# Patient Record
Sex: Female | Born: 1960 | Race: White | Hispanic: No | Marital: Single | Smoking: Never smoker
Health system: Southern US, Community
[De-identification: ages and names within clinical notes are randomized; demographics above are authoritative.]

## PROBLEM LIST (undated history)

## (undated) DIAGNOSIS — Z923 Personal history of irradiation: Secondary | ICD-10-CM

## (undated) DIAGNOSIS — C50919 Malignant neoplasm of unspecified site of unspecified female breast: Secondary | ICD-10-CM

## (undated) DIAGNOSIS — K219 Gastro-esophageal reflux disease without esophagitis: Secondary | ICD-10-CM

## (undated) DIAGNOSIS — E785 Hyperlipidemia, unspecified: Secondary | ICD-10-CM

## (undated) DIAGNOSIS — K5190 Ulcerative colitis, unspecified, without complications: Secondary | ICD-10-CM

## (undated) DIAGNOSIS — I10 Essential (primary) hypertension: Secondary | ICD-10-CM

## (undated) DIAGNOSIS — Z8719 Personal history of other diseases of the digestive system: Secondary | ICD-10-CM

## (undated) HISTORY — PX: ABDOMINAL HYSTERECTOMY: SHX81

---
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
## 1991-09-14 HISTORY — PX: WISDOM TOOTH EXTRACTION: SHX21

## 2004-08-27 ENCOUNTER — Ambulatory Visit: Payer: Self-pay | Admitting: Obstetrics and Gynecology

## 2004-09-16 ENCOUNTER — Ambulatory Visit: Payer: Self-pay | Admitting: Obstetrics and Gynecology

## 2005-08-09 ENCOUNTER — Ambulatory Visit: Payer: Self-pay | Admitting: Obstetrics and Gynecology

## 2006-09-29 ENCOUNTER — Ambulatory Visit: Payer: Self-pay | Admitting: Obstetrics and Gynecology

## 2007-10-12 ENCOUNTER — Ambulatory Visit: Payer: Self-pay | Admitting: Obstetrics and Gynecology

## 2008-10-25 ENCOUNTER — Ambulatory Visit: Payer: Self-pay | Admitting: Obstetrics and Gynecology

## 2009-10-27 ENCOUNTER — Ambulatory Visit: Payer: Self-pay | Admitting: Obstetrics and Gynecology

## 2010-10-28 ENCOUNTER — Ambulatory Visit: Payer: Self-pay | Admitting: Obstetrics and Gynecology

## 2011-11-24 ENCOUNTER — Ambulatory Visit: Payer: Self-pay | Admitting: Obstetrics and Gynecology

## 2012-02-02 ENCOUNTER — Ambulatory Visit: Payer: Self-pay | Admitting: Gastroenterology

## 2012-03-17 ENCOUNTER — Ambulatory Visit: Payer: Self-pay | Admitting: Gastroenterology

## 2012-09-13 HISTORY — PX: UPPER GI ENDOSCOPY: SHX6162

## 2012-12-15 ENCOUNTER — Ambulatory Visit: Payer: Self-pay | Admitting: Obstetrics and Gynecology

## 2012-12-26 ENCOUNTER — Ambulatory Visit: Payer: Self-pay | Admitting: Obstetrics and Gynecology

## 2013-12-17 ENCOUNTER — Ambulatory Visit: Payer: Self-pay | Admitting: Obstetrics and Gynecology

## 2014-12-27 ENCOUNTER — Other Ambulatory Visit: Payer: Self-pay | Admitting: Obstetrics and Gynecology

## 2014-12-27 DIAGNOSIS — Z1231 Encounter for screening mammogram for malignant neoplasm of breast: Secondary | ICD-10-CM

## 2015-02-14 ENCOUNTER — Ambulatory Visit
Admission: RE | Admit: 2015-02-14 | Discharge: 2015-02-14 | Disposition: A | Discharge status: Home / Self Care | Payer: BC Managed Care – PPO | Source: Ambulatory Visit | Attending: Obstetrics and Gynecology | Admitting: Obstetrics and Gynecology

## 2015-02-14 DIAGNOSIS — Z1231 Encounter for screening mammogram for malignant neoplasm of breast: Secondary | ICD-10-CM | POA: Diagnosis present

## 2015-10-29 ENCOUNTER — Other Ambulatory Visit: Payer: Self-pay | Admitting: Obstetrics and Gynecology

## 2015-10-29 DIAGNOSIS — Z1231 Encounter for screening mammogram for malignant neoplasm of breast: Secondary | ICD-10-CM

## 2015-11-04 ENCOUNTER — Encounter
Admission: RE | Admit: 2015-11-04 | Discharge: 2015-11-04 | Disposition: A | Discharge status: Home / Self Care | Payer: BC Managed Care – PPO | Source: Ambulatory Visit | Attending: Obstetrics and Gynecology | Admitting: Obstetrics and Gynecology

## 2015-11-04 DIAGNOSIS — Z01812 Encounter for preprocedural laboratory examination: Secondary | ICD-10-CM | POA: Diagnosis present

## 2015-11-04 DIAGNOSIS — I10 Essential (primary) hypertension: Secondary | ICD-10-CM | POA: Diagnosis not present

## 2015-11-04 DIAGNOSIS — Z01810 Encounter for preprocedural cardiovascular examination: Secondary | ICD-10-CM | POA: Diagnosis present

## 2015-11-04 HISTORY — DX: Gastro-esophageal reflux disease without esophagitis: K21.9

## 2015-11-04 HISTORY — DX: Essential (primary) hypertension: I10

## 2015-11-04 LAB — BASIC METABOLIC PANEL
Anion gap: 6 (ref 5–15)
BUN: 12 mg/dL (ref 6–20)
CALCIUM: 10.3 mg/dL (ref 8.9–10.3)
CO2: 27 mmol/L (ref 22–32)
Chloride: 106 mmol/L (ref 101–111)
Creatinine, Ser: 0.9 mg/dL (ref 0.44–1.00)
GFR calc Af Amer: >60 mL/min (ref >60)
GLUCOSE: 87 mg/dL (ref 65–99)
Potassium: 3.6 mmol/L (ref 3.5–5.1)
Sodium: 139 mmol/L (ref 135–145)

## 2015-11-04 LAB — CBC
HCT: 36.3 % (ref 35.0–47.0)
Hemoglobin: 11.6 g/dL — ABNORMAL LOW (ref 12.0–16.0)
MCH: 25.4 pg — AB (ref 26.0–34.0)
MCHC: 32 g/dL (ref 32.0–36.0)
MCV: 79.4 fL — ABNORMAL LOW (ref 80.0–100.0)
Platelets: 197 10*3/uL (ref 150–440)
RBC: 4.57 MIL/uL (ref 3.80–5.20)
RDW: 15.1 % — AB (ref 11.5–14.5)
WBC: 7.5 10*3/uL (ref 3.6–11.0)

## 2015-11-04 LAB — TYPE AND SCREEN
ABO/RH(D): O POS
ANTIBODY SCREEN: NEGATIVE

## 2015-11-04 LAB — ABO/RH: ABO/RH(D): O POS

## 2015-11-04 NOTE — H&P (Signed)
[name] is a 55 y.o. female here for Annual Exam .  menorrhagia q 21 days . ++ clots . Known fibroid utx . U/S 14 months ago with 5 fibroids at least noted . Pt without vasomotor sx . On OCPS .  Repeat u/s last week : 6 fibroids 4-7 cm each . EMBX + cervical polyp benign . pap last yr + HPV , this yr pending  Past Medical History:  has a past medical history of Esophagitis (03/17/2012); Fibroid, uterine; Hyperlipidemia, unspecified; and Hypertension.  Past Surgical History:  has a past surgical history that includes other surgery (N/A, 1979); other surgery (1969); egd (03/17/2012); and BROKEN ARM (1974). Family History: family history includes Coronary artery disease in her brother; Diabetes mellitus in her mother, paternal grandfather, and paternal grandmother; Hypertension in her mother; Prostate cancer in her father; Stroke in her father. Social History:  reports that she has never smoked. She does not have any smokeless tobacco history on file. She reports that she does not drink alcohol or use illicit drugs. OB/GYN History:  OB History    Gravida Para Term Preterm AB TAB SAB Ectopic Multiple Living   0 0 0 0 0 0 0 0 0 0       Allergies: has No Known Allergies. Medications:  Current Outpatient Prescriptions:  . bisoprolol-hydrochlorothiazide (ZIAC) 5-6.25 mg tablet, Take 1 tablet by mouth once daily., Disp: 90 tablet, Rfl: 1 . norethindrone-ethinyl estradiol (OVCON-35) 0.4-35 mg-mcg tablet, Take 1 tablet by mouth once daily., Disp: 3 Package, Rfl: 4  Review of Systems: General:   No fatigue or weight loss Eyes:   No vision changes Ears:   No hearing difficulty Respiratory:   No cough or shortness of breath Pulmonary:   No asthma or shortness of breath Cardiovascular:  No chest pain, palpitations, dyspnea on exertion Gastrointestinal:  No abdominal bloating, chronic diarrhea, constipations, masses, pain or hematochezia Genitourinary:  No hematuria, dysuria, abnormal vaginal  discharge, pelvic pain, Menometrorrhagia Lymphatic:  No swollen lymph nodes Musculoskeletal: No muscle weakness Neurologic:  No extremity weakness, syncope, seizure disorder Psychiatric:  No history of depression, delusions or suicidal/homicidal ideation   Exam:      Vitals:   10/29/15 1352  BP: 140/73  Pulse: 68    Body mass index is 31.24 kg/(m^2).  WDWN white/ female in NAD  Lungs: CTA  CV : RRR without murmur  Breast: exam done in sitting and lying position : No dimpling or retraction, no dominant mass, no spontaneous discharge, no axillary adenopathy Neck: no thyromegaly Abdomen: soft , no mass, normal active bowel sounds, non-tender, no rebound tenderness Pelvic: tanner stage 5 ,  External genitalia: vulva /labia no lesions Urethra: no prolapse Vagina: normal physiologic d/c Cervix: cervical polyp , no cervical motion tenderness  Cervical polypectomy done 7x7 mm  Uterus:18 weeks irregular shaped .  Adnexa: no mass, non-tender  Rectovaginal: no mass   Impression:   Symptomatic fibroid UTX , menorrhagia . Multiple fibroids  Bleeding resulting from large fibroid uterus  Plan:  Reviewed fibroid pathophysiology and menorrhagia . Pt has elected for a TAH  And bilateral salpingectomy  Risks benefits of the procedure have been discussed with the pt .  All questions answered .

## 2015-11-04 NOTE — Patient Instructions (Signed)
  Your procedure is scheduled on: Monday Feb. 27, 2017. Report to Same Day Surgery. To find out your arrival time please call [phone] between 1PM - 3PM on Friday Feb. 24, 2017.  Remember: Instructions that are not followed completely may result in serious medical risk, up to and including death, or upon the discretion of your surgeon and anesthesiologist your surgery may need to be rescheduled.    _x___ 1. Do not eat food or drink liquids after midnight. No gum chewing or hard candies.     ____ 2. No Alcohol for 24 hours before or after surgery.   ____ 3. Bring all medications with you on the day of surgery if instructed.    __x__ 4. Notify your doctor if there is any change in your medical condition     (cold, fever, infections).     Do not wear jewelry, make-up, hairpins, clips or nail polish.  Do not wear lotions, powders, or perfumes. You may wear deodorant.  Do not shave 48 hours prior to surgery. Men may shave face and neck.  Do not bring valuables to the hospital.    [facility] is not responsible for any belongings or valuables.               Contacts, dentures or bridgework may not be worn into surgery.  Leave your suitcase in the car. After surgery it may be brought to your room.  For patients admitted to the hospital, discharge time is determined by your treatment team.   Patients discharged the day of surgery will not be allowed to drive home.    Please read over the following fact sheets that you were given:   [facility] Preparing for Surgery  ____ Take these medicines the morning of surgery with A SIP OF WATER: None    ____ Fleet Enema (as directed)   _x___ Use CHG Soap as directed  ____ Use inhalers on the day of surgery  ____ Stop metformin 2 days prior to surgery    ____ Take 1/2 of usual insulin dose the night before surgery and none on the morning of  surgery.   ____ Stop Coumadin/Plavix/aspirin on denies.  _x___ Stop Anti-inflammatories now.   OK to take Tylenol.   ____ Stop supplements until after surgery.    ____ Bring C-Pap to the hospital.

## 2015-11-10 ENCOUNTER — Inpatient Hospital Stay: Payer: BC Managed Care – PPO | Admitting: Anesthesiology

## 2015-11-10 ENCOUNTER — Encounter: Payer: Self-pay | Admitting: *Deleted

## 2015-11-10 ENCOUNTER — Encounter
Admission: RE | Disposition: A | Discharge status: Home / Self Care | Payer: Self-pay | Source: Ambulatory Visit | Attending: Obstetrics and Gynecology

## 2015-11-10 ENCOUNTER — Inpatient Hospital Stay
Admission: RE | Admit: 2015-11-10 | Discharge: 2015-11-13 | DRG: 743 | Disposition: A | Discharge status: Home / Self Care | Payer: BC Managed Care – PPO | Source: Ambulatory Visit | Attending: Obstetrics and Gynecology | Admitting: Obstetrics and Gynecology

## 2015-11-10 DIAGNOSIS — Z79899 Other long term (current) drug therapy: Secondary | ICD-10-CM | POA: Diagnosis not present

## 2015-11-10 DIAGNOSIS — Z8249 Family history of ischemic heart disease and other diseases of the circulatory system: Secondary | ICD-10-CM | POA: Diagnosis not present

## 2015-11-10 DIAGNOSIS — N920 Excessive and frequent menstruation with regular cycle: Secondary | ICD-10-CM | POA: Diagnosis present

## 2015-11-10 DIAGNOSIS — Z833 Family history of diabetes mellitus: Secondary | ICD-10-CM | POA: Diagnosis not present

## 2015-11-10 DIAGNOSIS — K209 Esophagitis, unspecified: Secondary | ICD-10-CM | POA: Diagnosis present

## 2015-11-10 DIAGNOSIS — Z823 Family history of stroke: Secondary | ICD-10-CM

## 2015-11-10 DIAGNOSIS — Z8042 Family history of malignant neoplasm of prostate: Secondary | ICD-10-CM | POA: Diagnosis not present

## 2015-11-10 DIAGNOSIS — E785 Hyperlipidemia, unspecified: Secondary | ICD-10-CM | POA: Diagnosis present

## 2015-11-10 DIAGNOSIS — I10 Essential (primary) hypertension: Secondary | ICD-10-CM | POA: Diagnosis present

## 2015-11-10 DIAGNOSIS — D259 Leiomyoma of uterus, unspecified: Principal | ICD-10-CM | POA: Diagnosis present

## 2015-11-10 DIAGNOSIS — Z9889 Other specified postprocedural states: Secondary | ICD-10-CM

## 2015-11-10 DIAGNOSIS — N841 Polyp of cervix uteri: Secondary | ICD-10-CM | POA: Diagnosis present

## 2015-11-10 LAB — POCT PREGNANCY, URINE: PREG TEST UR: NEGATIVE

## 2015-11-10 SURGERY — HYSTERECTOMY, TOTAL, ABDOMINAL, WITH SALPINGECTOMY
Anesthesia: General | Laterality: Bilateral

## 2015-11-10 MED ORDER — FAMOTIDINE 20 MG PO TABS
ORAL_TABLET | ORAL | Status: AC
  Administered 2015-11-10: 20 mg via ORAL
  Filled 2015-11-10: qty 1

## 2015-11-10 MED ORDER — DIPHENHYDRAMINE HCL 12.5 MG/5ML PO ELIX
12.5000 mg | ORAL_SOLUTION | Freq: Four times a day (QID) | ORAL | Status: DC | PRN
  Filled 2015-11-10: qty 5

## 2015-11-10 MED ORDER — DIPHENHYDRAMINE HCL 50 MG/ML IJ SOLN: 12.5000 mg | Freq: Four times a day (QID) | INTRAMUSCULAR | Status: DC | PRN

## 2015-11-10 MED ORDER — FENTANYL CITRATE (PF) 100 MCG/2ML IJ SOLN
INTRAMUSCULAR | Status: DC | PRN
  Administered 2015-11-10 (×2): 50 ug via INTRAVENOUS
  Administered 2015-11-10: 100 ug via INTRAVENOUS
  Administered 2015-11-10: 50 ug via INTRAVENOUS

## 2015-11-10 MED ORDER — CITRIC ACID-SODIUM CITRATE 334-500 MG/5ML PO SOLN
30.0000 mL | ORAL | Status: DC
  Filled 2015-11-10: qty 30

## 2015-11-10 MED ORDER — HYDROMORPHONE HCL 1 MG/ML IJ SOLN
INTRAMUSCULAR | Status: AC
  Administered 2015-11-10: 0.5 mg via INTRAVENOUS
  Filled 2015-11-10: qty 1

## 2015-11-10 MED ORDER — HYDROMORPHONE HCL 1 MG/ML IJ SOLN
0.5000 mg | INTRAMUSCULAR | Status: DC | PRN
  Administered 2015-11-10 (×2): 0.5 mg via INTRAVENOUS

## 2015-11-10 MED ORDER — PROPOFOL 10 MG/ML IV BOLUS
INTRAVENOUS | Status: DC | PRN
  Administered 2015-11-10: 150 mg via INTRAVENOUS

## 2015-11-10 MED ORDER — ONDANSETRON HCL 4 MG PO TABS: 4.0000 mg | ORAL_TABLET | Freq: Four times a day (QID) | ORAL | Status: DC | PRN

## 2015-11-10 MED ORDER — FENTANYL CITRATE (PF) 100 MCG/2ML IJ SOLN
INTRAMUSCULAR | Status: AC
  Administered 2015-11-10: 25 ug via INTRAVENOUS
  Filled 2015-11-10: qty 2

## 2015-11-10 MED ORDER — KETOROLAC TROMETHAMINE 30 MG/ML IJ SOLN
30.0000 mg | Freq: Three times a day (TID) | INTRAMUSCULAR | Status: AC | PRN
  Administered 2015-11-10 (×2): 30 mg via INTRAVENOUS
  Filled 2015-11-10 (×2): qty 1

## 2015-11-10 MED ORDER — LACTATED RINGERS IV SOLN: INTRAVENOUS | Status: DC

## 2015-11-10 MED ORDER — CEFOXITIN SODIUM-DEXTROSE 2-2.2 GM-% IV SOLR (PREMIX)
INTRAVENOUS | Status: AC
  Filled 2015-11-10: qty 50

## 2015-11-10 MED ORDER — ONDANSETRON HCL 4 MG/2ML IJ SOLN: 4.0000 mg | Freq: Four times a day (QID) | INTRAMUSCULAR | Status: DC | PRN

## 2015-11-10 MED ORDER — CEFOXITIN SODIUM-DEXTROSE 2-2.2 GM-% IV SOLR (PREMIX)
2.0000 g | INTRAVENOUS | Status: AC
  Administered 2015-11-10: 2 g via INTRAVENOUS

## 2015-11-10 MED ORDER — NEOSTIGMINE METHYLSULFATE 10 MG/10ML IV SOLN
INTRAVENOUS | Status: DC | PRN
  Administered 2015-11-10: 3 mg via INTRAVENOUS

## 2015-11-10 MED ORDER — MIDAZOLAM HCL 2 MG/2ML IJ SOLN
INTRAMUSCULAR | Status: DC | PRN
  Administered 2015-11-10: 2 mg via INTRAVENOUS

## 2015-11-10 MED ORDER — FENTANYL CITRATE (PF) 100 MCG/2ML IJ SOLN
25.0000 ug | INTRAMUSCULAR | Status: AC | PRN
  Administered 2015-11-10 (×6): 25 ug via INTRAVENOUS

## 2015-11-10 MED ORDER — ACETAMINOPHEN 10 MG/ML IV SOLN
INTRAVENOUS | Status: DC | PRN
  Administered 2015-11-10: 1000 mg via INTRAVENOUS

## 2015-11-10 MED ORDER — ONDANSETRON HCL 4 MG/2ML IJ SOLN: 4.0000 mg | Freq: Once | INTRAMUSCULAR | Status: DC | PRN

## 2015-11-10 MED ORDER — LIDOCAINE HCL (CARDIAC) 20 MG/ML IV SOLN
INTRAVENOUS | Status: DC | PRN
  Administered 2015-11-10: 100 mg via INTRAVENOUS

## 2015-11-10 MED ORDER — SODIUM CHLORIDE 0.9% FLUSH: 9.0000 mL | INTRAVENOUS | Status: DC | PRN

## 2015-11-10 MED ORDER — MORPHINE SULFATE 2 MG/ML IV SOLN
INTRAVENOUS | Status: DC
  Administered 2015-11-10: 16:00:00 via INTRAVENOUS
  Administered 2015-11-10: 3.86 mL via INTRAVENOUS
  Administered 2015-11-10: 3.18 mL via INTRAVENOUS
  Administered 2015-11-11: 20 mg via INTRAVENOUS
  Administered 2015-11-11: 1.78 mL via INTRAVENOUS
  Filled 2015-11-10: qty 25

## 2015-11-10 MED ORDER — HYDROMORPHONE HCL 1 MG/ML IJ SOLN
INTRAMUSCULAR | Status: DC | PRN
  Administered 2015-11-10 (×2): 0.5 mg via INTRAVENOUS

## 2015-11-10 MED ORDER — ACETAMINOPHEN 10 MG/ML IV SOLN
INTRAVENOUS | Status: AC
  Filled 2015-11-10: qty 100

## 2015-11-10 MED ORDER — LACTATED RINGERS IV SOLN
INTRAVENOUS | Status: DC
  Administered 2015-11-10 – 2015-11-12 (×5): via INTRAVENOUS

## 2015-11-10 MED ORDER — ONDANSETRON HCL 4 MG/2ML IJ SOLN
INTRAMUSCULAR | Status: DC | PRN
  Administered 2015-11-10: 4 mg via INTRAVENOUS

## 2015-11-10 MED ORDER — LACTATED RINGERS IV SOLN
INTRAVENOUS | Status: DC
  Administered 2015-11-10 (×3): via INTRAVENOUS

## 2015-11-10 MED ORDER — ROCURONIUM BROMIDE 100 MG/10ML IV SOLN
INTRAVENOUS | Status: DC | PRN
  Administered 2015-11-10: 40 mg via INTRAVENOUS
  Administered 2015-11-10 (×6): 5 mg via INTRAVENOUS

## 2015-11-10 MED ORDER — GLYCOPYRROLATE 0.2 MG/ML IJ SOLN
INTRAMUSCULAR | Status: DC | PRN
  Administered 2015-11-10: 0.4 mg via INTRAVENOUS

## 2015-11-10 MED ORDER — DEXAMETHASONE SODIUM PHOSPHATE 10 MG/ML IJ SOLN
INTRAMUSCULAR | Status: DC | PRN
  Administered 2015-11-10: 4 mg via INTRAVENOUS

## 2015-11-10 MED ORDER — NALOXONE HCL 0.4 MG/ML IJ SOLN
0.4000 mg | INTRAMUSCULAR | Status: DC | PRN
Start: 2015-11-10 — End: 2015-11-11

## 2015-11-10 MED ORDER — FAMOTIDINE 20 MG PO TABS
20.0000 mg | ORAL_TABLET | Freq: Once | ORAL | Status: AC
  Administered 2015-11-10: 20 mg via ORAL

## 2015-11-10 SURGICAL SUPPLY — 40 items
CANISTER SUCT 1200ML W/VALVE (MISCELLANEOUS) ×3 IMPLANT
CATH TRAY 16F METER LATEX (MISCELLANEOUS) ×3 IMPLANT
CHLORAPREP W/TINT 26ML (MISCELLANEOUS) ×3 IMPLANT
DRAPE LAP W/FLUID (DRAPES) ×3 IMPLANT
DRAPE UNDER BUTTOCK W/FLU (DRAPES) ×3 IMPLANT
DRSG OPSITE POSTOP 4X8 (GAUZE/BANDAGES/DRESSINGS) ×3 IMPLANT
DRSG TELFA 3X8 NADH (GAUZE/BANDAGES/DRESSINGS) ×3 IMPLANT
ELECT BLADE 6.5 EXT (BLADE) ×6 IMPLANT
ELECT CAUTERY BLADE 6.4 (BLADE) ×6 IMPLANT
ELECT REM PT RETURN 9FT ADLT (ELECTROSURGICAL) ×3
ELECTRODE REM PT RTRN 9FT ADLT (ELECTROSURGICAL) ×1 IMPLANT
GAUZE SPONGE 4X4 12PLY STRL (GAUZE/BANDAGES/DRESSINGS) ×3 IMPLANT
GLOVE BIO SURGEON STRL SZ8 (GLOVE) ×12 IMPLANT
GOWN STRL REUS W/ TWL LRG LVL3 (GOWN DISPOSABLE) ×2 IMPLANT
GOWN STRL REUS W/ TWL XL LVL3 (GOWN DISPOSABLE) ×1 IMPLANT
GOWN STRL REUS W/TWL LRG LVL3 (GOWN DISPOSABLE) ×4
GOWN STRL REUS W/TWL XL LVL3 (GOWN DISPOSABLE) ×2
HEMOSTAT ARISTA ABSORB 1G (Miscellaneous) ×3 IMPLANT
KIT RM TURNOVER CYSTO AR (KITS) ×3 IMPLANT
LABEL OR SOLS (LABEL) ×3 IMPLANT
PACK BASIN MAJOR ARMC (MISCELLANEOUS) ×3 IMPLANT
PENCIL ELECTRO HAND CTR (MISCELLANEOUS) ×3 IMPLANT
RETAINER VISCERA MED (MISCELLANEOUS) ×3 IMPLANT
SOL PREP PVP 2OZ (MISCELLANEOUS) ×3
SOLUTION PREP PVP 2OZ (MISCELLANEOUS) ×1 IMPLANT
SPONGE LAP 18X18 5 PK (GAUZE/BANDAGES/DRESSINGS) ×3 IMPLANT
SPONGE XRAY 4X4 16PLY STRL (MISCELLANEOUS) ×3 IMPLANT
STAPLER SKIN PROX 35W (STAPLE) ×3 IMPLANT
SURGILUBE 2OZ TUBE FLIPTOP (MISCELLANEOUS) ×3 IMPLANT
SUT PDS AB 1 TP1 96 (SUTURE) ×6 IMPLANT
SUT VIC AB 0 CT1 27 (SUTURE) ×6
SUT VIC AB 0 CT1 27XCR 8 STRN (SUTURE) ×3 IMPLANT
SUT VIC AB 0 CT1 36 (SUTURE) ×6 IMPLANT
SUT VIC AB 2-0 CT1 (SUTURE) ×3 IMPLANT
SUT VIC AB 2-0 SH 27 (SUTURE) ×8
SUT VIC AB 2-0 SH 27XBRD (SUTURE) ×4 IMPLANT
SUT VICRYL PLUS ABS 0 54 (SUTURE) ×3 IMPLANT
SYR BULB IRRIG 60ML STRL (SYRINGE) ×3 IMPLANT
TRAY PREP VAG/GEN (MISCELLANEOUS) ×3 IMPLANT
WATER STERILE IRR 1000ML POUR (IV SOLUTION) ×3 IMPLANT

## 2015-11-10 NOTE — Anesthesia Preprocedure Evaluation (Addendum)
Anesthesia Evaluation  Patient identified by MRN, date of birth, ID band Patient awake    Reviewed: Allergy & Precautions, NPO status , Patient's Chart, lab work & pertinent test results, reviewed documented beta blocker date and time   Airway Mallampati: II  TM Distance: >3 FB     Dental  (+) Chipped   Pulmonary           Cardiovascular hypertension, Pt. on medications and Pt. on home beta blockers      Neuro/Psych    GI/Hepatic GERD  ,  Endo/Other    Renal/GU      Musculoskeletal   Abdominal   Peds  Hematology   Anesthesia Other Findings Mandible surgery. Can open mouth well. Anemic 11.6.  Reproductive/Obstetrics                            Anesthesia Physical Anesthesia Plan  ASA: II  Anesthesia Plan: General   Post-op Pain Management:    Induction: Intravenous  Airway Management Planned: Oral ETT  Additional Equipment:   Intra-op Plan:   Post-operative Plan:   Informed Consent: I have reviewed the patients History and Physical, chart, labs and discussed the procedure including the risks, benefits and alternatives for the proposed anesthesia with the patient or authorized representative who has indicated his/her understanding and acceptance.     Plan Discussed with: CRNA  Anesthesia Plan Comments:         Anesthesia Quick Evaluation

## 2015-11-10 NOTE — Transfer of Care (Signed)
Immediate Anesthesia Transfer of Care Note  Patient: [name]  Procedure(s) Performed: Procedure(s): HYSTERECTOMY ABDOMINAL WITH SALPINGECTOMY (Bilateral)  Patient Location: PACU  Anesthesia Type:General  Level of Consciousness: awake, confused and responds to stimulation  Airway & Oxygen Therapy: Patient Spontanous Breathing and Patient connected to face mask oxygen  Post-op Assessment: Report given to RN and Post -op Vital signs reviewed and stable  Post vital signs: Reviewed and stable  Last Vitals:  Filed Vitals:   11/10/15 0752 11/10/15 1321  BP: 139/100 99/56  Pulse: 80 60  Temp: 35.7 C 37.1 C  Resp: 18 20    Complications: No apparent anesthesia complications

## 2015-11-10 NOTE — Progress Notes (Signed)
Patient ID: [name], female   DOB: [birth year], 55 y.o.   MRN: [id number] DOS . PAin in good control . VSS  No vaginal bleeding Urine output 50 cc/ hr - clear A; stable P: cont care

## 2015-11-10 NOTE — Anesthesia Postprocedure Evaluation (Signed)
Anesthesia Post Note  Patient: [name]  Procedure(s) Performed: Procedure(s) (LRB): HYSTERECTOMY ABDOMINAL WITH SALPINGECTOMY (Bilateral)  Patient location during evaluation: PACU Anesthesia Type: General Level of consciousness: awake and alert Pain management: pain level controlled Vital Signs Assessment: post-procedure vital signs reviewed and stable Respiratory status: spontaneous breathing, nonlabored ventilation, respiratory function stable and patient connected to nasal cannula oxygen Cardiovascular status: blood pressure returned to baseline and stable Postop Assessment: no signs of nausea or vomiting Anesthetic complications: no    Last Vitals:  Filed Vitals:   11/10/15 1934 11/10/15 1935  BP:  124/75  Pulse:  65  Temp:  36.7 C  Resp: 12 15    Last Pain:  Filed Vitals:   11/10/15 1939  PainSc: [place]

## 2015-11-10 NOTE — Brief Op Note (Signed)
11/10/2015  1:12 PM  PATIENT:  [name]  55 y.o. female  PRE-OPERATIVE DIAGNOSIS:  menorrhagia,fibroid utx  POST-OPERATIVE DIAGNOSIS:  menorrhagia; fibroid uterus  PROCEDURE:  Procedure(s): HYSTERECTOMY ABDOMINAL WITH SALPINGECTOMY (Bilateral)  SURGEON:  Surgeon(s) and Role:    * Boykin Nearing, MD - Primary    * Benjaman Kindler, MD - Assisting  PHYSICIAN ASSISTANT: [place] , pa student  ASSISTANTS: none   ANESTHESIA:   general  EBL:  Total I/O In: -  Out: 900 [Urine:450; Blood:450] IOF : 2000 cc  BLOOD ADMINISTERED:none  DRAINS: Urinary Catheter (Foley)   LOCAL MEDICATIONS USED:  NONE  SPECIMEN:  Source of Specimen:  cervix , uterus , tubes bilat  DISPOSITION OF SPECIMEN:  PATHOLOGY  COUNTS:  YES  TOURNIQUET:  * No tourniquets in log *  DICTATION: .Other Dictation: Dictation Number verbal  PLAN OF CARE: Admit to inpatient   PATIENT DISPOSITION:  PACU - hemodynamically stable.   Delay start of Pharmacological VTE agent (>24hrs) due to surgical blood loss or risk of bleeding: not applicable

## 2015-11-10 NOTE — Anesthesia Procedure Notes (Signed)
Procedure Name: Intubation Performed by: Lance Muss Pre-anesthesia Checklist: Patient identified, Emergency Drugs available, Suction available, Patient being monitored and Timeout performed Patient Re-evaluated:Patient Re-evaluated prior to inductionOxygen Delivery Method: Circle system utilized Preoxygenation: Pre-oxygenation with 100% oxygen Intubation Type: IV induction Ventilation: Mask ventilation without difficulty Laryngoscope Size: Mac and 3 Grade View: Grade I Tube type: Oral Tube size: 7.0 mm Number of attempts: 1 Airway Equipment and Method: Stylet Placement Confirmation: ETT inserted through vocal cords under direct vision,  positive ETCO2 and breath sounds checked- equal and bilateral Secured at: 21 cm Tube secured with: Tape Dental Injury: Teeth and Oropharynx as per pre-operative assessment

## 2015-11-10 NOTE — Progress Notes (Signed)
Pt is ready for surgery : TAH / bilateral salpingectomy . She will take her Ziac before surgery with sips . Labs reviewed . Neg HCG . All questions answered . Ready to proceed with surgery

## 2015-11-11 LAB — BASIC METABOLIC PANEL
ANION GAP: 7 (ref 5–15)
BUN: 12 mg/dL (ref 6–20)
CALCIUM: 8.4 mg/dL — AB (ref 8.9–10.3)
CHLORIDE: 108 mmol/L (ref 101–111)
CO2: 24 mmol/L (ref 22–32)
CREATININE: 0.84 mg/dL (ref 0.44–1.00)
GFR calc Af Amer: >60 mL/min (ref >60)
GFR calc non Af Amer: >60 mL/min (ref >60)
GLUCOSE: 94 mg/dL (ref 65–99)
POTASSIUM: 3.9 mmol/L (ref 3.5–5.1)
SODIUM: 139 mmol/L (ref 135–145)

## 2015-11-11 LAB — CBC
HCT: 27.6 % — ABNORMAL LOW (ref 35.0–47.0)
HEMOGLOBIN: 8.9 g/dL — AB (ref 12.0–16.0)
MCH: 25.2 pg — AB (ref 26.0–34.0)
MCHC: 32.3 g/dL (ref 32.0–36.0)
MCV: 78.1 fL — AB (ref 80.0–100.0)
PLATELETS: 160 10*3/uL (ref 150–440)
RBC: 3.54 MIL/uL — AB (ref 3.80–5.20)
RDW: 15 % — ABNORMAL HIGH (ref 11.5–14.5)
WBC: 12.1 10*3/uL — ABNORMAL HIGH (ref 3.6–11.0)

## 2015-11-11 LAB — SURGICAL PATHOLOGY

## 2015-11-11 MED ORDER — OXYCODONE-ACETAMINOPHEN 5-325 MG PO TABS
1.0000 | ORAL_TABLET | ORAL | Status: DC | PRN
  Administered 2015-11-11 (×2): 2 via ORAL
  Administered 2015-11-12 (×2): 1 via ORAL
  Filled 2015-11-11: qty 2
  Filled 2015-11-11 (×2): qty 1
  Filled 2015-11-11: qty 2

## 2015-11-11 MED ORDER — DOCUSATE SODIUM 100 MG PO CAPS
100.0000 mg | ORAL_CAPSULE | Freq: Two times a day (BID) | ORAL | Status: DC
  Administered 2015-11-11 – 2015-11-13 (×5): 100 mg via ORAL
  Filled 2015-11-11 (×5): qty 1

## 2015-11-11 MED ORDER — ONDANSETRON HCL 4 MG PO TABS: 8.0000 mg | ORAL_TABLET | Freq: Three times a day (TID) | ORAL | Status: DC | PRN

## 2015-11-11 MED ORDER — NAPROXEN 500 MG PO TABS
500.0000 mg | ORAL_TABLET | Freq: Two times a day (BID) | ORAL | Status: DC
  Administered 2015-11-11 – 2015-11-13 (×4): 500 mg via ORAL
  Filled 2015-11-11 (×4): qty 1

## 2015-11-11 NOTE — Progress Notes (Signed)
1 Day Post-Op Procedure(s) (LRB): HYSTERECTOMY ABDOMINAL WITH SALPINGECTOMY (Bilateral)  Subjective: Patient reports incisional pain( moderate)    Objective: I have reviewed patient's vital signs, intake and output and labs. cv RRR General: alert and cooperative Resp: clear to auscultation bilaterally GI: soft, non-tender; bowel sounds normal; no masses,  no organomegaly incision C/D/I Good urine output - clear Assessment: s/p Procedure(s): HYSTERECTOMY ABDOMINAL WITH SALPINGECTOMY (Bilateral): stable  Plan: Advance diet Advance to PO medication Discontinue IV fluids d/c foley  LOS: 1 day    [name] 11/11/2015, 9:03 AM

## 2015-11-11 NOTE — Op Note (Signed)
[name], [name]                 ACCOUNT NO.:  [id number]  MEDICAL RECORD NO.:  [id number]  LOCATION:  346A                         FACILITY:  [facility]  PHYSICIAN:  Laverta Baltimore, MDDATE OF BIRTH:  07/16/1961  DATE OF PROCEDURE: DATE OF DISCHARGE:                              OPERATIVE REPORT   PREOPERATIVE DIAGNOSIS: 1. Symptomatic fibroid uterus. 2. Menorrhagia.  POSTOPERATIVE DIAGNOSIS: 1. Symptomatic fibroid uterus. 2. Menorrhagia.  PROCEDURE:  Total abdominal hysterectomy and bilateral salpingectomy.  SURGEON:  Laverta Baltimore, MD.  FIRST ASSISTANT:  Leafy Ro, physician assistant.  PA student, Ginette Otto.  ANESTHESIA:  General endotracheal anesthesia.  INDICATION:  A 55 year old, gravida 0 patient with a 20-week fibroid uterus noted on exam.  The patient has been dealing this for many years. Endometrial biopsy and Pap smear both of which were normal.  The patient has ultrasound demonstrated at least 6 fibroids measuring 4-7 cm.  DESCRIPTION OF PROCEDURE:  After adequate general endotracheal anesthesia, the patient was prepped and draped in normal sterile fashion.  Foley catheter was placed with clear urine resulting.  The patient underwent a time-out.  The patient did receive 2 g IV cefoxitin prior to commencement of the case.  Given the size of the uterus, a vertical midline incision was made.  Sharp dissection was used to identify the fascia.  Fascia was opened in the midline and the peritoneum was opened sharply.  The peritoneum was opened in a cephalad and caudad fashion.  The uterus was then brought through the incision. Multiple fibroids were noted.  Two Kelly clamps were placed on the cornua and window was placed in the broad ligament and doubly clamped with Haney clamps, transected, and suture ligated with 0 Vicryl suture. The broad ligament was then clamped bilaterally down the side of the uterus and ultimately the uterine arteries at the isthmic region  or bilaterally clamped, transected, and suture ligated with 0 Vicryl suture.  The uterus was then decompressed by removing multiple fibroids at the fundal area.  This allowed for better visualization.  The bowel had previously been packed cephalad with the O'Connor-O'Sullivan retractor.  The bladder was dissected off the lower uterine segment with sharp dissection and with a sponge stick.  Cardinal ligaments were then clamped with a straight Haney clamp, transected, suture ligated with 0 Vicryl suture and vaginal angles were then clamped with Haney clamps and the vagina was entered and the cervix and remaining part of the uterus were removed intact.  The vaginal cuff was then closed with interrupted 0 Vicryl suture.  Good hemostasis was noted.  There was one area that was oozing a bit on the left anterior aspect of the cuff, this required two separate figure-of-eight 2-0 Vicryl sutures for hemostasis.  Each fallopian tube was then grasped with Babcock clamps and clamped and transected and suture ligated with 0 Vicryl suture.  Ovaries appeared normal.  Normal peristaltic activity was identified of the ureters bilaterally.  The patient's abdomen was copiously irrigated and good hemostasis was noted.  Arista was applied to the anterior cuff area given the proximity of the bladder to the vaginal cuff.  No additional suturing was performed.  Good hemostasis was noted.  Sponges were removed, and sponge and needle count were correct.  The anterior abdominal wall was closed with #1 looped PDS suture, performed in the modified Smead-Jones manner, good approximation of the fascia and separate suture was used at the inferior aspect and the two were tied essentially.  Subcutaneous tissues were irrigated and Bovie for hemostasis, and the skin was reapproximated with staples.  There were no complications.  ESTIMATED BLOOD LOSS:  450 mL of blood.  INTRAOPERATIVE FLUIDS:  2000 mL.  URINE OUTPUT:   450 mL.  The patient tolerated the procedure, was taken to the recovery room in good condition.          ______________________________ Laverta Baltimore, MD     TS/MEDQ  D:  11/10/2015  T:  11/11/2015  Job:  [id number]

## 2015-11-12 NOTE — Progress Notes (Signed)
2 Days Post-Op Procedure(s) (LRB): HYSTERECTOMY ABDOMINAL WITH SALPINGECTOMY (Bilateral)  Subjective: Patient reports tolerating liquids well .  Pain ok  No flatus  Objective: General: alert and cooperative Resp: clear to auscultation bilaterally Cardio: regular rate and rhythm, S1, S2 normal, no murmur, click, rub or gallop GI: soft, non-tender; bowel sounds normal; no masses,  no organomegaly incision C/D/I  Assessment: s/p Procedure(s): HYSTERECTOMY ABDOMINAL WITH SALPINGECTOMY (Bilateral): stable and progressing well  Plan: Encourage ambulation  D/C IV  Advance diet once flatus  LOS: 2 days    [name] 11/12/2015, 10:12 AM

## 2015-11-13 MED ORDER — ONDANSETRON HCL 8 MG PO TABS: 8.0000 mg | ORAL_TABLET | Freq: Three times a day (TID) | ORAL | Status: DC | PRN

## 2015-11-13 MED ORDER — DOCUSATE SODIUM 100 MG PO CAPS: 100.0000 mg | ORAL_CAPSULE | Freq: Two times a day (BID) | ORAL | Status: DC

## 2015-11-13 MED ORDER — HYDROCODONE-ACETAMINOPHEN 5-325 MG PO TABS: 1.0000 | ORAL_TABLET | Freq: Four times a day (QID) | ORAL | Status: DC | PRN

## 2015-11-13 NOTE — Progress Notes (Signed)
Patient understands all discharge instructions and the need to go to follow up appointments. Patient discharge via wheelchair with auxillary.

## 2015-11-13 NOTE — Discharge Summary (Signed)
Physician Discharge Summary  Patient ID: [name] MRN: [id number] DOB/AGE: 04/10/1961 55 y.o.  Admit date: 11/10/2015 Discharge date: 11/13/2015  Admission Diagnoses:symptomatic fibroid, menorrrhagia  Discharge Diagnoses: same  Active Problems:   Post-operative state   Discharged Condition: good  Hospital Course: pt underwent an uncomplicated TAH / bilateral salpingectomy . 20 week fibroid UTX . Vertical incision . Post op did well . Post op hct 27.6 . Bun / cr 12/0.8   Consults: None  Significant Diagnostic Studies: none  Treatments: surgery: TAH / bilat sal[pingectomy   Discharge Exam: Lungs CTA  CV rrr  Abd + bs , non distended , incision healing . , C/D/I   Blood pressure 117/76, pulse 68, temperature 97.6 F (36.4 C), temperature source Oral, resp. rate 20, height 5' 4"  (1.626 m), weight 180 lb (81.647 kg), last menstrual period 11/01/2015, SpO2 97 %.   Disposition: Final discharge disposition not confirmed  Discharge Instructions    Call MD for:  difficulty breathing, headache or visual disturbances    Complete by:  As directed      Call MD for:  extreme fatigue    Complete by:  As directed      Call MD for:  hives    Complete by:  As directed      Call MD for:  persistant dizziness or light-headedness    Complete by:  As directed      Call MD for:  persistant nausea and vomiting    Complete by:  As directed      Call MD for:  redness, tenderness, or signs of infection (pain, swelling, redness, odor or green/yellow discharge around incision site)    Complete by:  As directed      Call MD for:  severe uncontrolled pain    Complete by:  As directed      Call MD for:  temperature >100.4    Complete by:  As directed      Diet - low sodium heart healthy    Complete by:  As directed      Increase activity slowly    Complete by:  As directed             Medication List    STOP taking these medications        norethindrone-ethinyl estradiol 0.4-35  MG-MCG tablet  Commonly known as:  OVCON-35,BALZIVA,BRIELLYN      TAKE these medications        bisoprolol-hydrochlorothiazide 5-6.25 MG tablet  Commonly known as:  ZIAC  Take 1 tablet by mouth every morning.     docusate sodium 100 MG capsule  Commonly known as:  COLACE  Take 1 capsule (100 mg total) by mouth 2 (two) times daily.     HYDROcodone-acetaminophen 5-325 MG tablet  Commonly known as:  NORCO  Take 1 tablet by mouth every 6 (six) hours as needed for moderate pain.     ondansetron 8 MG tablet  Commonly known as:  ZOFRAN  Take 1 tablet (8 mg total) by mouth every 8 (eight) hours as needed for nausea or vomiting.           Follow-up Information    Follow up with Derril Franek, MD In 5 days.   Specialty:  Obstetrics and Gynecology   Why:  For wound re-check, For suture removal at [facility] Tuesday   Contact information:   [address] [place] [address] [phone]       Signed: Laverta Baltimore 11/13/2015,  8:16 AM

## 2016-02-16 ENCOUNTER — Ambulatory Visit: Payer: BC Managed Care – PPO

## 2016-03-01 ENCOUNTER — Ambulatory Visit
Admission: RE | Admit: 2016-03-01 | Discharge: 2016-03-01 | Disposition: A | Discharge status: Home / Self Care | Payer: BC Managed Care – PPO | Source: Ambulatory Visit | Attending: Obstetrics and Gynecology | Admitting: Obstetrics and Gynecology

## 2016-03-01 ENCOUNTER — Other Ambulatory Visit: Payer: Self-pay | Admitting: Obstetrics and Gynecology

## 2016-03-01 DIAGNOSIS — Z1231 Encounter for screening mammogram for malignant neoplasm of breast: Secondary | ICD-10-CM | POA: Diagnosis not present

## 2016-03-08 ENCOUNTER — Other Ambulatory Visit: Payer: Self-pay | Admitting: Obstetrics and Gynecology

## 2016-03-08 DIAGNOSIS — R928 Other abnormal and inconclusive findings on diagnostic imaging of breast: Secondary | ICD-10-CM

## 2016-03-12 ENCOUNTER — Ambulatory Visit
Admission: RE | Admit: 2016-03-12 | Discharge: 2016-03-12 | Disposition: A | Discharge status: Home / Self Care | Payer: BC Managed Care – PPO | Source: Ambulatory Visit | Attending: Obstetrics and Gynecology | Admitting: Obstetrics and Gynecology

## 2016-03-12 ENCOUNTER — Other Ambulatory Visit: Payer: BC Managed Care – PPO

## 2016-03-12 ENCOUNTER — Ambulatory Visit: Payer: BC Managed Care – PPO

## 2016-03-12 DIAGNOSIS — N6489 Other specified disorders of breast: Secondary | ICD-10-CM | POA: Diagnosis not present

## 2016-03-12 DIAGNOSIS — R928 Other abnormal and inconclusive findings on diagnostic imaging of breast: Secondary | ICD-10-CM

## 2016-10-26 ENCOUNTER — Other Ambulatory Visit: Payer: Self-pay | Admitting: Obstetrics and Gynecology

## 2016-10-26 DIAGNOSIS — Z1231 Encounter for screening mammogram for malignant neoplasm of breast: Secondary | ICD-10-CM

## 2017-03-02 ENCOUNTER — Ambulatory Visit: Payer: BC Managed Care – PPO

## 2017-03-03 ENCOUNTER — Ambulatory Visit
Admission: RE | Admit: 2017-03-03 | Discharge: 2017-03-03 | Disposition: A | Discharge status: Home / Self Care | Payer: BC Managed Care – PPO | Source: Ambulatory Visit | Attending: Obstetrics and Gynecology | Admitting: Obstetrics and Gynecology

## 2017-03-03 DIAGNOSIS — Z1231 Encounter for screening mammogram for malignant neoplasm of breast: Secondary | ICD-10-CM | POA: Insufficient documentation

## 2018-01-24 ENCOUNTER — Other Ambulatory Visit: Payer: Self-pay | Admitting: Student

## 2018-01-24 DIAGNOSIS — Z1231 Encounter for screening mammogram for malignant neoplasm of breast: Secondary | ICD-10-CM

## 2018-03-10 ENCOUNTER — Ambulatory Visit
Admission: RE | Admit: 2018-03-10 | Discharge: 2018-03-10 | Disposition: A | Discharge status: Home / Self Care | Payer: BC Managed Care – PPO | Source: Ambulatory Visit | Attending: Student | Admitting: Student

## 2018-03-10 DIAGNOSIS — Z1231 Encounter for screening mammogram for malignant neoplasm of breast: Secondary | ICD-10-CM | POA: Diagnosis present

## 2019-02-06 ENCOUNTER — Other Ambulatory Visit: Payer: Self-pay | Admitting: Student

## 2019-02-28 ENCOUNTER — Other Ambulatory Visit: Payer: Self-pay | Admitting: Student

## 2019-02-28 DIAGNOSIS — Z1231 Encounter for screening mammogram for malignant neoplasm of breast: Secondary | ICD-10-CM

## 2019-03-20 ENCOUNTER — Ambulatory Visit
Admission: RE | Admit: 2019-03-20 | Discharge: 2019-03-20 | Disposition: A | Discharge status: Home / Self Care | Payer: BC Managed Care – PPO | Source: Ambulatory Visit | Attending: Student | Admitting: Student

## 2019-03-20 ENCOUNTER — Other Ambulatory Visit: Payer: Self-pay

## 2019-03-20 DIAGNOSIS — Z1231 Encounter for screening mammogram for malignant neoplasm of breast: Secondary | ICD-10-CM | POA: Insufficient documentation

## 2020-02-19 ENCOUNTER — Other Ambulatory Visit: Payer: Self-pay | Admitting: Student

## 2020-02-19 DIAGNOSIS — Z1231 Encounter for screening mammogram for malignant neoplasm of breast: Secondary | ICD-10-CM

## 2020-03-20 ENCOUNTER — Ambulatory Visit
Admission: RE | Admit: 2020-03-20 | Discharge: 2020-03-20 | Disposition: A | Discharge status: Home / Self Care | Payer: BC Managed Care – PPO | Source: Ambulatory Visit | Attending: Student | Admitting: Student

## 2020-03-20 DIAGNOSIS — Z1231 Encounter for screening mammogram for malignant neoplasm of breast: Secondary | ICD-10-CM | POA: Insufficient documentation

## 2021-02-18 ENCOUNTER — Other Ambulatory Visit: Payer: Self-pay | Admitting: Student

## 2021-02-18 DIAGNOSIS — Z1231 Encounter for screening mammogram for malignant neoplasm of breast: Secondary | ICD-10-CM

## 2021-03-27 ENCOUNTER — Ambulatory Visit
Admission: RE | Admit: 2021-03-27 | Discharge: 2021-03-27 | Disposition: A | Discharge status: Home / Self Care | Payer: BC Managed Care – PPO | Source: Ambulatory Visit | Attending: Student | Admitting: Student

## 2021-03-27 ENCOUNTER — Other Ambulatory Visit: Payer: Self-pay

## 2021-03-27 DIAGNOSIS — Z1231 Encounter for screening mammogram for malignant neoplasm of breast: Secondary | ICD-10-CM | POA: Diagnosis present

## 2022-02-12 ENCOUNTER — Other Ambulatory Visit: Payer: Self-pay | Admitting: Student

## 2022-02-12 DIAGNOSIS — Z1231 Encounter for screening mammogram for malignant neoplasm of breast: Secondary | ICD-10-CM

## 2022-03-30 ENCOUNTER — Ambulatory Visit
Admission: RE | Admit: 2022-03-30 | Discharge: 2022-03-30 | Disposition: A | Discharge status: Home / Self Care | Payer: BC Managed Care – PPO | Source: Ambulatory Visit | Attending: Student | Admitting: Student

## 2022-03-30 DIAGNOSIS — Z1231 Encounter for screening mammogram for malignant neoplasm of breast: Secondary | ICD-10-CM | POA: Insufficient documentation

## 2022-04-02 ENCOUNTER — Other Ambulatory Visit: Payer: Self-pay | Admitting: Student

## 2022-04-02 DIAGNOSIS — N63 Unspecified lump in unspecified breast: Secondary | ICD-10-CM

## 2022-04-02 DIAGNOSIS — R928 Other abnormal and inconclusive findings on diagnostic imaging of breast: Secondary | ICD-10-CM

## 2022-04-06 ENCOUNTER — Ambulatory Visit
Admission: RE | Admit: 2022-04-06 | Discharge: 2022-04-06 | Disposition: A | Discharge status: Home / Self Care | Payer: BC Managed Care – PPO | Source: Ambulatory Visit | Attending: Student | Admitting: Student

## 2022-04-06 DIAGNOSIS — R928 Other abnormal and inconclusive findings on diagnostic imaging of breast: Secondary | ICD-10-CM

## 2022-04-06 DIAGNOSIS — N63 Unspecified lump in unspecified breast: Secondary | ICD-10-CM | POA: Diagnosis present

## 2022-04-12 ENCOUNTER — Other Ambulatory Visit: Payer: Self-pay | Admitting: Student

## 2022-04-12 DIAGNOSIS — R928 Other abnormal and inconclusive findings on diagnostic imaging of breast: Secondary | ICD-10-CM

## 2022-04-12 DIAGNOSIS — N63 Unspecified lump in unspecified breast: Secondary | ICD-10-CM

## 2022-04-20 ENCOUNTER — Other Ambulatory Visit: Payer: BC Managed Care – PPO

## 2022-04-21 ENCOUNTER — Ambulatory Visit
Admission: RE | Admit: 2022-04-21 | Discharge: 2022-04-21 | Disposition: A | Discharge status: Home / Self Care | Payer: BC Managed Care – PPO | Source: Ambulatory Visit | Attending: Student | Admitting: Student

## 2022-04-21 ENCOUNTER — Other Ambulatory Visit: Payer: Self-pay | Admitting: Student

## 2022-04-21 DIAGNOSIS — N63 Unspecified lump in unspecified breast: Secondary | ICD-10-CM | POA: Insufficient documentation

## 2022-04-21 DIAGNOSIS — R928 Other abnormal and inconclusive findings on diagnostic imaging of breast: Secondary | ICD-10-CM | POA: Diagnosis present

## 2022-04-21 HISTORY — PX: BREAST BIOPSY: SHX20

## 2022-04-22 ENCOUNTER — Encounter: Payer: Self-pay | Admitting: *Deleted

## 2022-04-22 DIAGNOSIS — C50919 Malignant neoplasm of unspecified site of unspecified female breast: Secondary | ICD-10-CM

## 2022-04-22 NOTE — Progress Notes (Signed)
Received referral for newly diagnosed breast cancer from [facility].  Navigation initiated.  Med Onc and Surgical consults scheduled. She will see Dr. Bary Castilla on Thursday 04/29/22 at 11:15 and Dr. Rogue Bussing on Friday 8/18 at 9:15.

## 2022-04-23 LAB — SURGICAL PATHOLOGY

## 2022-04-29 ENCOUNTER — Other Ambulatory Visit: Payer: Self-pay | Admitting: General Surgery

## 2022-04-29 DIAGNOSIS — C50312 Malignant neoplasm of lower-inner quadrant of left female breast: Secondary | ICD-10-CM

## 2022-04-30 ENCOUNTER — Encounter: Payer: Self-pay | Admitting: *Deleted

## 2022-04-30 ENCOUNTER — Encounter: Payer: Self-pay | Admitting: Internal Medicine

## 2022-04-30 ENCOUNTER — Inpatient Hospital Stay: Payer: BC Managed Care – PPO | Attending: Internal Medicine | Admitting: Internal Medicine

## 2022-04-30 DIAGNOSIS — Z170 Estrogen receptor positive status [ER+]: Secondary | ICD-10-CM | POA: Insufficient documentation

## 2022-04-30 DIAGNOSIS — Z803 Family history of malignant neoplasm of breast: Secondary | ICD-10-CM | POA: Diagnosis not present

## 2022-04-30 DIAGNOSIS — C50312 Malignant neoplasm of lower-inner quadrant of left female breast: Secondary | ICD-10-CM

## 2022-04-30 NOTE — Assessment & Plan Note (Addendum)
#  #    Patient will likely need lumpectomy with sentinel lymph node evaluation; followed by radiation.  Systemic therapy options -chemotherapy or hormonal therapy breast cancer profile/which is still pending.  Also might need molecular testing [Oncotype/Mammaprint] after surgery.  Need for systemic adjuvant endocrine therapy based upon receptor status [which again still pending]. -----------------------------------------  # I had a long discussion with the patient in general regarding the treatment options of breast cancer including-surgery; adjuvant radiation; role of adjuvant systemic therapy including-chemotherapy antihormone therapy.    # Discussed surgical options-sentinel lymph node biopsy with lumpectomy versus mastectomy.  Discussed that lumpectomy is usually followed by adjuvant radiation.  Whereas mastectomy typically does not indicate radiation.   #Discussed that given patient's presurgical clinical characteristics less likely that she will need chemotherapy.  However final decision regarding chemotherapy based on final surgical pathology/gene assay.   #I discussed the role of endocrine therapy-given ER/PR positive disease postsurgery.  Patient will be offered antihormone pill 1 a day for 5 years.  Discussed potential downsides including but not limited to arthralgias hot flashes and osteoporosis.   # Genetic counseling: # Genetics: Discussed with the patient majority of breast cancers are sporadic however 10 to 20% at risk of genetic/hereditary cancer syndromes.  Discussed importance of genetic counseling/genetic testing-given her young age and bilateral breast cancers.  Patient interested in genetic counseling.  We will make a referral.  # Chemotherapy education; port placement. Hopefully the planned start chemotherapy in week of May 22nd.  Antiemetics-Zofran and Compazine; EMLA cream sent to pharmacy. MUGA scan to check heart function.   # DISPOSITION: # No labs today # genetic  counseling re: breast cancer  # follow up 1 month- MD; no labs  Dr.B

## 2022-04-30 NOTE — Progress Notes (Unsigned)
[name]  Patient Care Team: Jamie Denis, PA-C as PCP - General (Physician Assistant) Jamie Huge, RN as Oncology Nurse Navigator  CHIEF COMPLAINTS/PURPOSE OF CONSULTATION: Breast cancer  #  Oncology History Overview Note  FINDINGS: A spiculated left breast mass is identified in the lower inner left breast, best seen on the cc view.   Targeted ultrasound is performed, showing a spiculated irregular mass in the left breast at 8 o'clock, 6 cm from the nipple measuring 19 by 10 x 13 mm, correlating with the mammographic finding. No axillary adenopathy.   IMPRESSION: Highly suspicious spiculated left breast mass at 8 o'clock identified mammographically and sonographically.   RECOMMENDATION: Recommend ultrasound-guided biopsy of the left breast mass.   I have discussed the findings and recommendations with the patient. If applicable, a reminder letter will be sent to the patient regarding the next appointment.   BI-RADS CATEGORY  5: Highly suggestive of malignancy.  BREAST, LEFT, 8:00; CORE BIOPSIES:   - INVASIVE MAMMARY CARCINOMA, NO SPECIAL TYPE.   Size of invasive carcinoma: 11.5 mm in this sample  Histologic grade of invasive carcinoma: Grade 2 (of 3)                       Glandular/tubular differentiation score: 3 (of 3)                       Nuclear pleomorphism score: 2 (of 3)                       Mitotic rate score: 1 (of 3)                       Total score: 6 (of 9)  Ductal carcinoma in situ: Present  Lymphovascular invasion: Not identified   ER/PR/HER2: Immunohistochemistry will be performed on block A1, with  reflex to [place] for HER2 2+. The results will be reported in an addendum.   Comment:  The definitive grade will be assigned on the excisional specimen.    B.  BREAST, LEFT, 9:00; BIOPSIES:   - INVASIVE MAMMARY CARCINOMA, NO SPECIAL TYPE.   Size of invasive carcinoma: 4.5 mm in this sample  Histologic grade of  invasive carcinoma: Grade 1 (of 3)                       Glandular/tubular differentiation score: 2 (of 3)                       Nuclear pleomorphism score: 2 (of 3)                       Mitotic rate score: 1 (of 3)                       Total score: 5 (of 9)  Ductal carcinoma in situ: Not identified  Lymphovascular invasion: Not identified   # JULY-AUG 2023 - LEFT BREAST   # 8 o'clock-11 mm-ER/PR positive HER2 negative #9:00-4.5 mm ER/PR positive HER2 negative.   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  Accompanied by her sister.  [name] 61 y.o.  female female with no prior history of breast cancer/or malignancies has been referred to [place] for further evaluation recommendations for new diagnosis of breast cancer.   Patient states she was found to have an abnormal screening mammogram which led to diagnostic mammogram/ultrasound/followed by biopsy-as summarized above.  In the interim patient has met with Dr. Bary Castilla.  She is awaiting MRI of the breast given the multifocality.  Otherwise denies any lumps or bumps.  Denies any back pain or new onset of shortness of breath or cough.  Family history of breast cancer: Sister with breast cancer  Review of Systems  Constitutional:  Negative for chills, diaphoresis, fever, malaise/fatigue and weight loss.  HENT:  Negative for nosebleeds and sore throat.   Eyes:  Negative for double vision.  Respiratory:  Negative for cough, hemoptysis, sputum production, shortness of breath and wheezing.   Cardiovascular:  Negative for chest pain, palpitations, orthopnea and leg swelling.  Gastrointestinal:  Negative for abdominal pain, blood in stool,  constipation, diarrhea, heartburn, melena, nausea and vomiting.  Genitourinary:  Negative for dysuria, frequency and urgency.  Musculoskeletal:  Negative for back pain and joint pain.  Skin: Negative.  Negative for itching and rash.  Neurological:  Negative for dizziness, tingling, focal weakness, weakness and headaches.  Endo/Heme/Allergies:  Does not bruise/bleed easily.  Psychiatric/Behavioral:  Negative for depression. The patient is not nervous/anxious and does not have insomnia.      MEDICAL HISTORY:  Past Medical History:  Diagnosis Date   GERD (gastroesophageal reflux disease)    Hypertension     SURGICAL HISTORY: Past Surgical History:  Procedure Laterality Date   ABDOMINAL HYSTERECTOMY     BREAST BIOPSY Left 04/21/2022   [place] bx, path pending, ribbon marker   BREAST BIOPSY Left 04/21/2022   [place] bx, path pending, coil marker   EYE MUSCLE SURGERY Left 09/14/1967   FRACTURE SURGERY Left 09/13/1972   arm   MANDIBLE SURGERY Bilateral 09/13/1977   UPPER GI ENDOSCOPY  09/13/2012   stretching of esophagus   WISDOM TOOTH EXTRACTION  09/14/1991   3 wisdom teeth    SOCIAL HISTORY: Social History   Socioeconomic History   Marital status: Single    Spouse name: Not on file   Number of children: Not on file   Years of education: Not on file   Highest education level: Not on file  Occupational History   Not on file  Tobacco Use   Smoking status: Never   Smokeless tobacco: Not on file  Vaping Use   Vaping Use: Never used  Substance and Sexual Activity   Alcohol use: No   Drug use: No   Sexual activity: Not on file  Other Topics Concern   Not on file  Social History Narrative   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    Social Determinants of Health   Financial Resource Strain: Not on file  Food Insecurity: Not on file  Transportation Needs: Not on file  Physical Activity: Not on file  Stress: Not on file  Social  Connections: Not on file  Intimate Partner Violence: Not on file    FAMILY HISTORY: Family History  Problem Relation Age of Onset   Diabetes Mother    Prostate cancer Father    Breast cancer Sister    Heart attack Brother    Breast cancer Cousin 53  ALLERGIES:  is allergic to amoxicillin.  MEDICATIONS:  Current Outpatient Medications  Medication Sig Dispense Refill   atorvastatin (LIPITOR) 10 MG tablet Take 10 mg by mouth daily.     bisoprolol-hydrochlorothiazide (ZIAC) 5-6.25 MG tablet Take 1 tablet by mouth every morning.     mesalamine (LIALDA) 1.2 g EC tablet TAKE 1 TABLET (1.2 G TOTAL) BY MOUTH DAILY WITH BREAKFAST FOR 120 DAYS TAKE ONE TABLET DAILY AS PRESCRIBED.     ondansetron (ZOFRAN) 8 MG tablet Take 1 tablet (8 mg total) by mouth every 8 (eight) hours as needed for nausea or vomiting. 20 tablet 0   Probiotic Product (ALIGN) 4 MG CAPS Take 1 capsule by mouth daily.     No current facility-administered medications for this visit.    PHYSICAL EXAMINATION: ECOG PERFORMANCE STATUS: 0 - Asymptomatic  Vitals:   04/30/22 0906  BP: 132/87  Pulse: 64  Temp: 99.3 F (37.4 C)  SpO2: 98%   Filed Weights   04/30/22 0906  Weight: 178 lb 12.8 oz (81.1 kg)    Physical Exam Vitals and nursing note reviewed.  HENT:     Head: Normocephalic and atraumatic.     Mouth/Throat:     Pharynx: Oropharynx is clear.  Eyes:     Extraocular Movements: Extraocular movements intact.     Pupils: Pupils are equal, round, and reactive to light.  Cardiovascular:     Rate and Rhythm: Normal rate and regular rhythm.  Pulmonary:     Comments: Decreased breath sounds bilaterally.  Abdominal:     Palpations: Abdomen is soft.  Musculoskeletal:        General: Normal range of motion.     Cervical back: Normal range of motion.  Skin:    General: Skin is warm.  Neurological:     General: No focal deficit present.     Mental Status: She is alert and oriented to person, place, and time.   Psychiatric:        Behavior: Behavior normal.        Judgment: Judgment normal.      LABORATORY DATA:  I have reviewed the data as listed Lab Results  Component Value Date   WBC 12.1 (H) 11/11/2015   HGB 8.9 (L) 11/11/2015   HCT 27.6 (L) 11/11/2015   MCV 78.1 (L) 11/11/2015   PLT 160 11/11/2015   No results for input(s): "NA", "K", "CL", "CO2", "GLUCOSE", "BUN", "CREATININE", "CALCIUM", "GFRNONAA", "GFRAA", "PROT", "ALBUMIN", "AST", "ALT", "ALKPHOS", "BILITOT", "BILIDIR", "IBILI" in the last 8760 hours.  RADIOGRAPHIC STUDIES: I have personally reviewed the radiological images as listed and agreed with the findings in the report. [place] LT BREAST BX W LOC DEV 1ST LESION IMG BX SPEC US GUIDE  Addendum Date: 04/22/2022   ADDENDUM REPORT: 04/22/2022 12:58 ADDENDUM: PATHOLOGY revealed: Site A. BREAST, LEFT, 8:00; CORE BIOPSIES: - INVASIVE MAMMARY CARCINOMA, NO SPECIAL TYPE. Size of invasive carcinoma: 11.5 mm in this sample. Grade 2 (of 3). Ductal carcinoma in situ: Present. Lymphovascular invasion: Not identified. Pathology results are CONCORDANT with imaging findings, per Dr. Valentino Saxon. PATHOLOGY revealed: Site B. BREAST, LEFT, 9:00; BIOPSIES: - INVASIVE MAMMARY CARCINOMA, NO SPECIAL TYPE. Size of invasive carcinoma: 4.5 mm in this sample. Grade 1 (of 3). Ductal carcinoma in situ: Not identified Lymphovascular invasion: Not identified. Pathology results are CONCORDANT with imaging findings, per Dr. Valentino Saxon. Pathology results and recommendations below were discussed with patient by telephone on 04/22/2022. Patient reported biopsy site within normal limits with slight tenderness at  the site. Post biopsy care instructions were reviewed, questions were answered and my direct phone number was provided to patient. Patient was instructed to call [facility] if any concerns or questions arise related to the biopsy. RECOMMENDATIONS: 1. Surgical and oncological consultation.  Request for surgical consultation relayed to Casper Harrison RN at [facility] by Electa Sniff RN on 04/22/2022. 2. Recommend pre treatment bilateral breast MRI with and without contrast given breast density and multifocality. Pathology results reported by Electa Sniff RN on 04/22/2022. Electronically Signed   By: Valentino Saxon M.D.   On: 04/22/2022 12:58   Result Date: 04/22/2022 CLINICAL DATA:  Highly suspicious LEFT breast mass. EXAM: ULTRASOUND GUIDED LEFT BREAST CORE NEEDLE BIOPSY x2 COMPARISON:  Previous exam(s). PROCEDURE: I met with the patient and we discussed the procedure of ultrasound-guided biopsy, including benefits and alternatives. We discussed the high likelihood of a successful procedure. We discussed the risks of the procedure, including infection, bleeding, tissue injury, clip migration, and inadequate sampling. Informed written consent was given. The usual time-out protocol was performed immediately prior to the procedure. During setup for the procedure, there was incidental sonographic note of an indeterminate irregular hypoechoic mass with irregular margins at 9 o'clock 6 cm from the nipple. It measures 7 by 6 x 5 mm. It is located 1.3 cm from the margin of the highly suspicious mass at 8 o'clock. Discussion was held with patient regarding recommendation to proceed with biopsy of this second indeterminate mass in addition to the original mass. Patient agreed and 2 site biopsy was confirmed. Site 1: LEFT breast 8 o'clock 6 cm from the nipple Lesion quadrant: Lower inner quadrant Using sterile technique and 1% lidocaine and 1% lidocaine with epinephrine as local anesthetic, under direct ultrasound visualization, a 14 gauge spring-loaded device was used to perform biopsy of a mass at 8 o'clock 6 cm from the nipple using a medial approach. At the conclusion of the procedure a RIBBON shaped tissue marker clip was deployed into the biopsy cavity. Follow up 2 view mammogram was  performed and dictated separately. Site 2: LEFT breast 9 o'clock 6 cm from the nipple Lesion quadrant: Lower inner quadrant Using sterile technique and 1% lidocaine and 1% lidocaine with epinephrine as local anesthetic, under direct ultrasound visualization, a 14 gauge spring-loaded device was used to perform biopsy of a mass at 9 o'clock 6 cm from the nipple using a medial approach. At the conclusion of the procedure a COIL shaped tissue marker clip was deployed into the biopsy cavity. Follow up 2 view mammogram was performed and dictated separately. IMPRESSION: 1. Ultrasound guided biopsy of a mass at 8 o'clock 6 cm from the nipple. No apparent complications. 2. During set up for the procedure, there was incidental sonographic note of an indeterminate 7 mm mass at 9 o'clock 6 cm from the nipple. Ultrasound-guided biopsy of this mass was performed. No apparent complications. 3. With malignant biopsy results, recommend pre treatment breast MRI with and without contrast given breast density. Electronically Signed: By: Valentino Saxon M.D. On: 04/21/2022 09:55   [place] LT BREAST BX W LOC DEV EA ADD LESION IMG BX SPEC US GUIDE  Addendum Date: 04/22/2022   ADDENDUM REPORT: 04/22/2022 12:58 ADDENDUM: PATHOLOGY revealed: Site A. BREAST, LEFT, 8:00; CORE BIOPSIES: - INVASIVE MAMMARY CARCINOMA, NO SPECIAL TYPE. Size of invasive carcinoma: 11.5 mm in this sample. Grade 2 (of 3). Ductal carcinoma in situ: Present. Lymphovascular invasion: Not identified. Pathology results are CONCORDANT with imaging  findings, per Dr. Valentino Saxon. PATHOLOGY revealed: Site B. BREAST, LEFT, 9:00; BIOPSIES: - INVASIVE MAMMARY CARCINOMA, NO SPECIAL TYPE. Size of invasive carcinoma: 4.5 mm in this sample. Grade 1 (of 3). Ductal carcinoma in situ: Not identified Lymphovascular invasion: Not identified. Pathology results are CONCORDANT with imaging findings, per Dr. Valentino Saxon. Pathology results and recommendations below were  discussed with patient by telephone on 04/22/2022. Patient reported biopsy site within normal limits with slight tenderness at the site. Post biopsy care instructions were reviewed, questions were answered and my direct phone number was provided to patient. Patient was instructed to call [facility] if any concerns or questions arise related to the biopsy. RECOMMENDATIONS: 1. Surgical and oncological consultation. Request for surgical consultation relayed to Casper Harrison RN at [facility] by Electa Sniff RN on 04/22/2022. 2. Recommend pre treatment bilateral breast MRI with and without contrast given breast density and multifocality. Pathology results reported by Electa Sniff RN on 04/22/2022. Electronically Signed   By: Valentino Saxon M.D.   On: 04/22/2022 12:58   Result Date: 04/22/2022 CLINICAL DATA:  Highly suspicious LEFT breast mass. EXAM: ULTRASOUND GUIDED LEFT BREAST CORE NEEDLE BIOPSY x2 COMPARISON:  Previous exam(s). PROCEDURE: I met with the patient and we discussed the procedure of ultrasound-guided biopsy, including benefits and alternatives. We discussed the high likelihood of a successful procedure. We discussed the risks of the procedure, including infection, bleeding, tissue injury, clip migration, and inadequate sampling. Informed written consent was given. The usual time-out protocol was performed immediately prior to the procedure. During setup for the procedure, there was incidental sonographic note of an indeterminate irregular hypoechoic mass with irregular margins at 9 o'clock 6 cm from the nipple. It measures 7 by 6 x 5 mm. It is located 1.3 cm from the margin of the highly suspicious mass at 8 o'clock. Discussion was held with patient regarding recommendation to proceed with biopsy of this second indeterminate mass in addition to the original mass. Patient agreed and 2 site biopsy was confirmed. Site 1: LEFT breast 8 o'clock 6 cm from the nipple Lesion quadrant:  Lower inner quadrant Using sterile technique and 1% lidocaine and 1% lidocaine with epinephrine as local anesthetic, under direct ultrasound visualization, a 14 gauge spring-loaded device was used to perform biopsy of a mass at 8 o'clock 6 cm from the nipple using a medial approach. At the conclusion of the procedure a RIBBON shaped tissue marker clip was deployed into the biopsy cavity. Follow up 2 view mammogram was performed and dictated separately. Site 2: LEFT breast 9 o'clock 6 cm from the nipple Lesion quadrant: Lower inner quadrant Using sterile technique and 1% lidocaine and 1% lidocaine with epinephrine as local anesthetic, under direct ultrasound visualization, a 14 gauge spring-loaded device was used to perform biopsy of a mass at 9 o'clock 6 cm from the nipple using a medial approach. At the conclusion of the procedure a COIL shaped tissue marker clip was deployed into the biopsy cavity. Follow up 2 view mammogram was performed and dictated separately. IMPRESSION: 1. Ultrasound guided biopsy of a mass at 8 o'clock 6 cm from the nipple. No apparent complications. 2. During set up for the procedure, there was incidental sonographic note of an indeterminate 7 mm mass at 9 o'clock 6 cm from the nipple. Ultrasound-guided biopsy of this mass was performed. No apparent complications. 3. With malignant biopsy results, recommend pre treatment breast MRI with and without contrast given breast density. Electronically Signed: By: Colletta Maryland  Peacock M.D. On: 04/21/2022 09:55   MM CLIP PLACEMENT LEFT  Result Date: 04/21/2022 CLINICAL DATA:  Status post 2 site ultrasound-guided biopsy EXAM: 3D DIAGNOSTIC LEFT MAMMOGRAM POST ULTRASOUND BIOPSY COMPARISON:  Previous exam(s). FINDINGS: Site 1: 8 o'clock 6 from nipple 3D Mammographic images were obtained following ultrasound guided biopsy of a mass at 8 o'clock. The RIBBON biopsy marking clip is in expected position at the site of biopsy. It is within spiculated mass  noted mammographically. Site 2: 9 o'clock 6 cm from the nipple 3D Mammographic images were obtained following ultrasound guided biopsy of a mass at 9 o'clock. The COIL biopsy marking clip is in expected position at the site of biopsy. IMPRESSION: 1. Appropriate positioning of the RIBBON shaped biopsy marking clip at the site of biopsy in the spiculated mass. 2. Appropriate positioning of the COIL shaped biopsy marking clip at the site of biopsy in the LEFT inner breast. 3.  The 2 biopsy clips are approximately 3 cm apart. Final Assessment: Post Procedure Mammograms for Marker Placement Electronically Signed   By: Valentino Saxon M.D.   On: 04/21/2022 09:42  MM DIAG BREAST TOMO UNI LEFT  Result Date: 04/06/2022 CLINICAL DATA:  The patient was called back for a left breast mass. EXAM: DIGITAL DIAGNOSTIC UNILATERAL LEFT MAMMOGRAM WITH TOMOSYNTHESIS AND CAD; ULTRASOUND LEFT BREAST LIMITED TECHNIQUE: Left digital diagnostic mammography and breast tomosynthesis was performed. The images were evaluated with computer-aided detection.; Targeted ultrasound examination of the left breast was performed. COMPARISON:  Previous exam(s). ACR Breast Density Category c: The breast tissue is heterogeneously dense, which may obscure small masses. FINDINGS: A spiculated left breast mass is identified in the lower inner left breast, best seen on the cc view. Targeted ultrasound is performed, showing a spiculated irregular mass in the left breast at 8 o'clock, 6 cm from the nipple measuring 19 by 10 x 13 mm, correlating with the mammographic finding. No axillary adenopathy. IMPRESSION: Highly suspicious spiculated left breast mass at 8 o'clock identified mammographically and sonographically. RECOMMENDATION: Recommend ultrasound-guided biopsy of the left breast mass. I have discussed the findings and recommendations with the patient. If applicable, a reminder letter will be sent to the patient regarding the next appointment. BI-RADS  CATEGORY  5: Highly suggestive of malignancy. Electronically Signed   By: Dorise Bullion III M.D.   On: 04/06/2022 11:24  US BREAST LTD UNI LEFT INC AXILLA  Result Date: 04/06/2022 CLINICAL DATA:  The patient was called back for a left breast mass. EXAM: DIGITAL DIAGNOSTIC UNILATERAL LEFT MAMMOGRAM WITH TOMOSYNTHESIS AND CAD; ULTRASOUND LEFT BREAST LIMITED TECHNIQUE: Left digital diagnostic mammography and breast tomosynthesis was performed. The images were evaluated with computer-aided detection.; Targeted ultrasound examination of the left breast was performed. COMPARISON:  Previous exam(s). ACR Breast Density Category c: The breast tissue is heterogeneously dense, which may obscure small masses. FINDINGS: A spiculated left breast mass is identified in the lower inner left breast, best seen on the cc view. Targeted ultrasound is performed, showing a spiculated irregular mass in the left breast at 8 o'clock, 6 cm from the nipple measuring 19 by 10 x 13 mm, correlating with the mammographic finding. No axillary adenopathy. IMPRESSION: Highly suspicious spiculated left breast mass at 8 o'clock identified mammographically and sonographically. RECOMMENDATION: Recommend ultrasound-guided biopsy of the left breast mass. I have discussed the findings and recommendations with the patient. If applicable, a reminder letter will be sent to the patient regarding the next appointment. BI-RADS CATEGORY  5: Highly suggestive of  malignancy. Electronically Signed   By: Dorise Bullion III M.D.   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   #Given the multifocality patient is currently awaiting MRI breast.  Discussed with Dr. Bary Castilla.   I had a long discussion with the patient  in general regarding the treatment options of breast cancer including-surgery; adjuvant radiation; role of adjuvant systemic therapy including-chemotherapy antihormone therapy.    #I in general discussed surgical options-sentinel lymph node biopsy with lumpectomy versus mastectomy.  Discussed that lumpectomy is usually followed by adjuvant radiation.  Whereas mastectomy typically does not indicate radiation. While pending MRI- patient will likely end up getting lumpectomy with sentinel lymph node evaluation; followed by radiation.   #Discussed that given patient's presurgical clinical characteristics less likely that she will need chemotherapy.  However final decision regarding chemotherapy based on final surgical pathology/gene assay.   # I discussed the role of endocrine therapy-given ER/PR positive disease postsurgery.  Patient will be offered antihormone pill 1 a day for 5 years.  Discussed potential downsides including but not limited to arthralgias hot flashes and osteoporosis.   # Genetic counseling: # Genetics: Discussed with the patient majority of breast cancers are sporadic however 10 to 20% at risk of genetic/hereditary cancer syndromes.  Discussed importance of genetic counseling/genetic testing-given her young age and bilateral breast cancers.  Patient interested in genetic counseling.  We will make a referral-especially given the family history/sister with breast cancer.  Thank you Dr. Bary Castilla for allowing me to participate in the care of your pleasant patient. Please do not hesitate to contact me with questions or concerns in the interim.  Discussed with Golda Acre nurse navigator.  # DISPOSITION: # No labs today # genetic counseling re: breast cancer  # follow up 1 month- MD; no labs  Dr.B      All questions were answered. The patient/family knows to call the clinic with any problems, questions or concerns.       Cammie Sickle, MD 05/03/2022 8:38 AM

## 2022-04-30 NOTE — Progress Notes (Unsigned)
Accompanied patient and sister to initial medical oncology appointment.   Reviewed Breast Cancer treatment handbook.   Care plan summary given to patient.

## 2022-05-05 ENCOUNTER — Ambulatory Visit
Admission: RE | Admit: 2022-05-05 | Discharge: 2022-05-05 | Disposition: A | Discharge status: Home / Self Care | Payer: BC Managed Care – PPO | Source: Ambulatory Visit | Attending: General Surgery | Admitting: General Surgery

## 2022-05-05 DIAGNOSIS — Z170 Estrogen receptor positive status [ER+]: Secondary | ICD-10-CM | POA: Diagnosis present

## 2022-05-05 DIAGNOSIS — C50312 Malignant neoplasm of lower-inner quadrant of left female breast: Secondary | ICD-10-CM | POA: Diagnosis present

## 2022-05-05 MED ORDER — GADOBUTROL 1 MMOL/ML IV SOLN
7.5000 mL | Freq: Once | INTRAVENOUS | Status: AC | PRN
Start: 2022-05-05 — End: 2022-05-05
  Administered 2022-05-05: 7.5 mL via INTRAVENOUS

## 2022-05-06 ENCOUNTER — Other Ambulatory Visit: Payer: Self-pay | Admitting: General Surgery

## 2022-05-06 DIAGNOSIS — C50312 Malignant neoplasm of lower-inner quadrant of left female breast: Secondary | ICD-10-CM

## 2022-05-10 ENCOUNTER — Other Ambulatory Visit: Payer: Self-pay | Admitting: General Surgery

## 2022-05-10 DIAGNOSIS — Z170 Estrogen receptor positive status [ER+]: Secondary | ICD-10-CM

## 2022-05-15 IMAGING — MG MM DIGITAL SCREENING BILAT W/ TOMO AND CAD
8 series · 8 of 24 positions shown · non-contrast
Comparison: Previous exam(s).

CLINICAL DATA: Screening.

EXAM:
DIGITAL SCREENING BILATERAL MAMMOGRAM WITH TOMOSYNTHESIS AND CAD
TECHNIQUE: Bilateral screening digital craniocaudal and mediolateral oblique
mammograms were obtained. Bilateral screening digital breast
tomosynthesis was performed. The images were evaluated with
computer-aided detection.

[L MLO synth-2D]
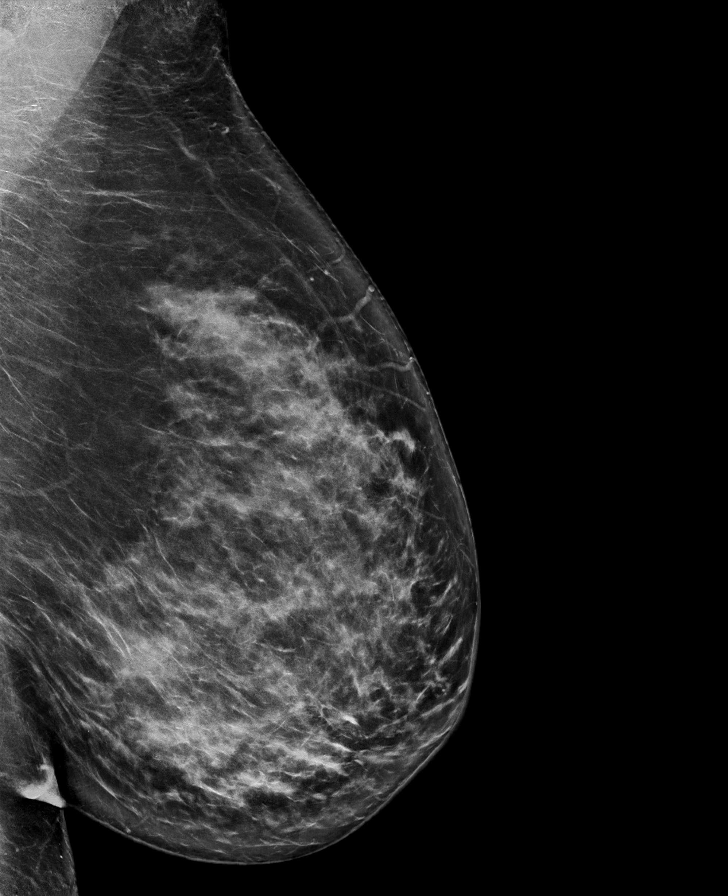

[R MLO synth-2D]
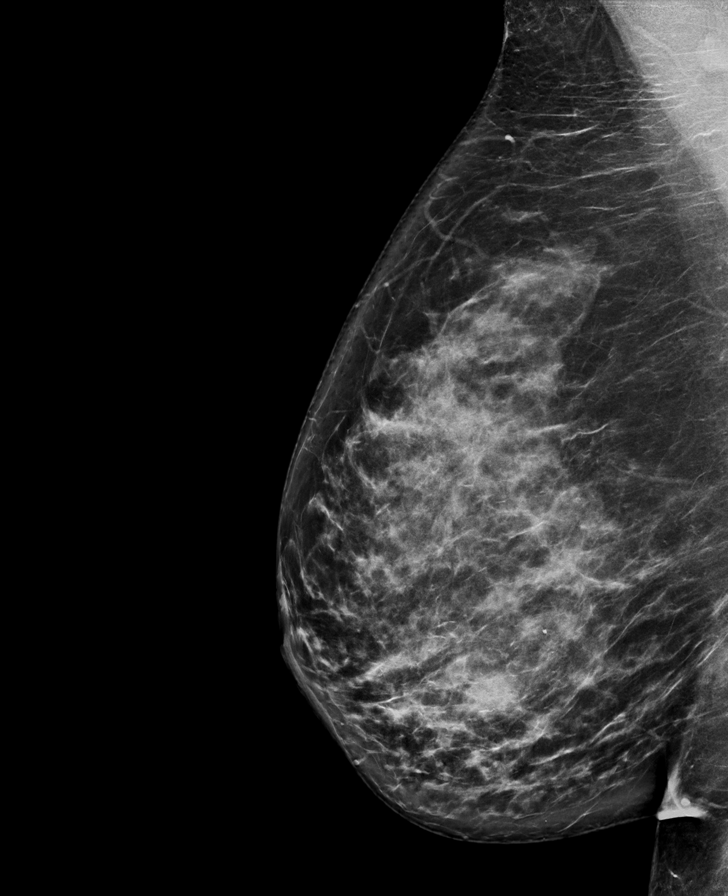

[L CC synth-2D]
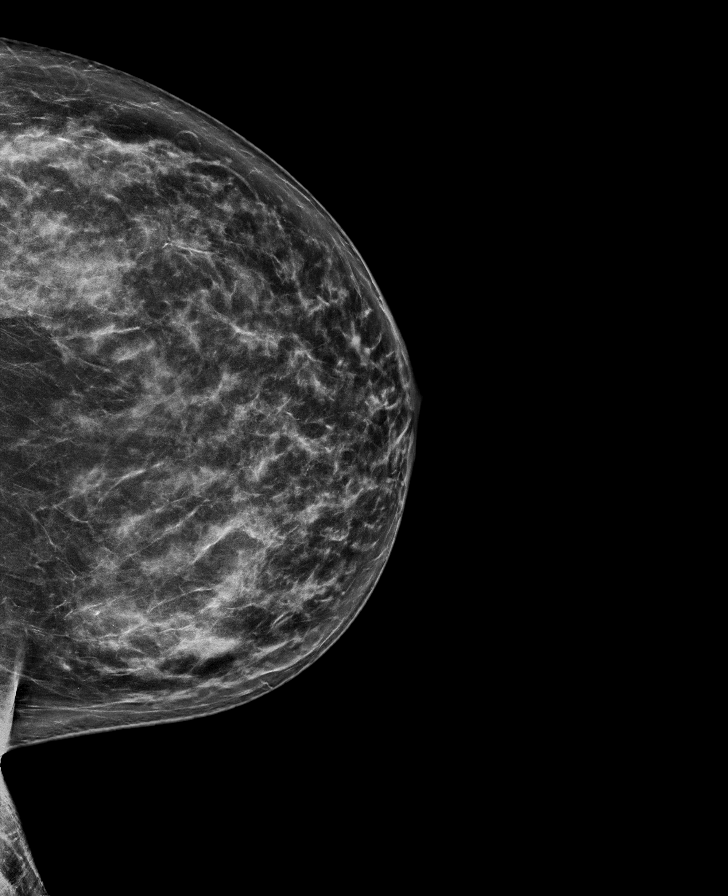

[R CC synth-2D]
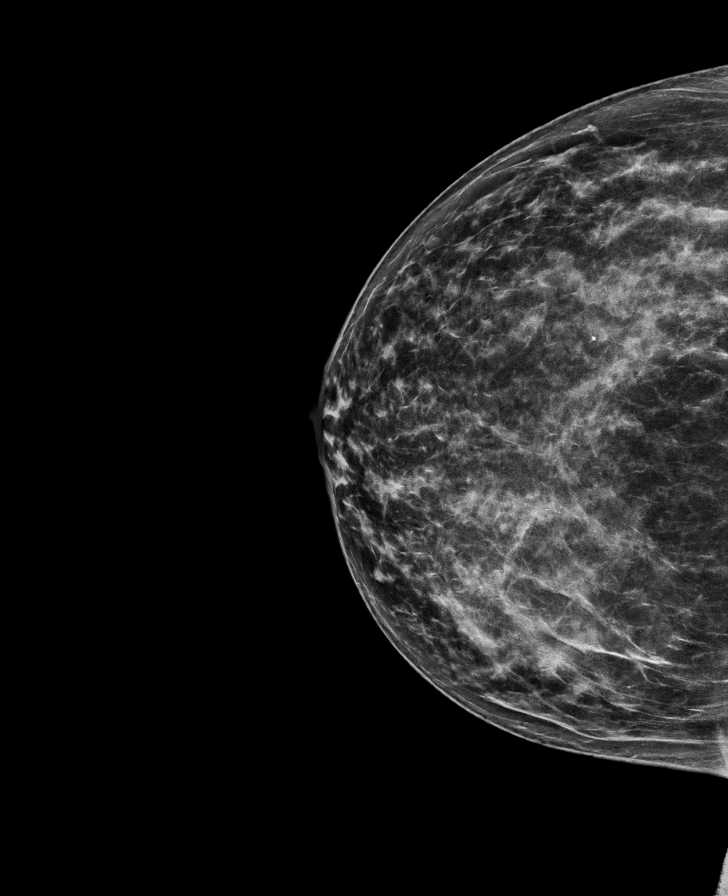

[L CC tomo · tomo slice 39/77.0]
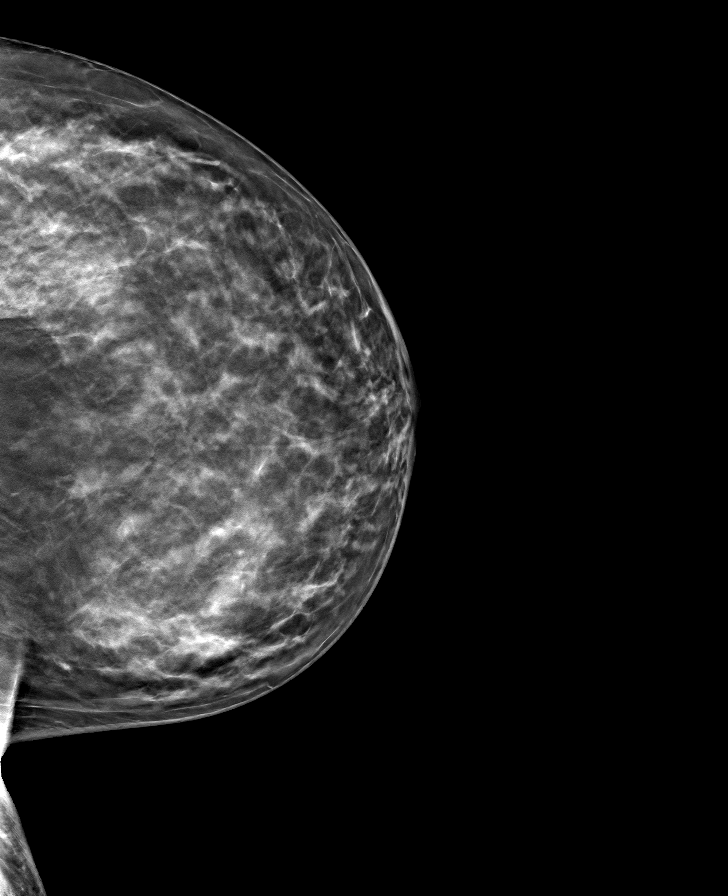

[L MLO tomo · tomo slice 46/91.0]
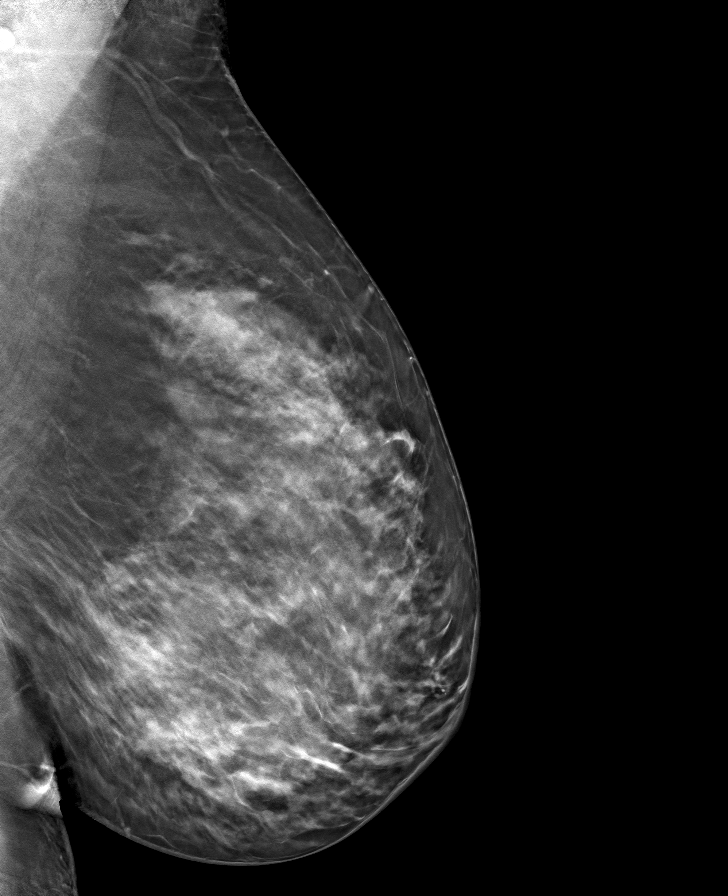

[R MLO tomo · tomo slice 47/94.0]
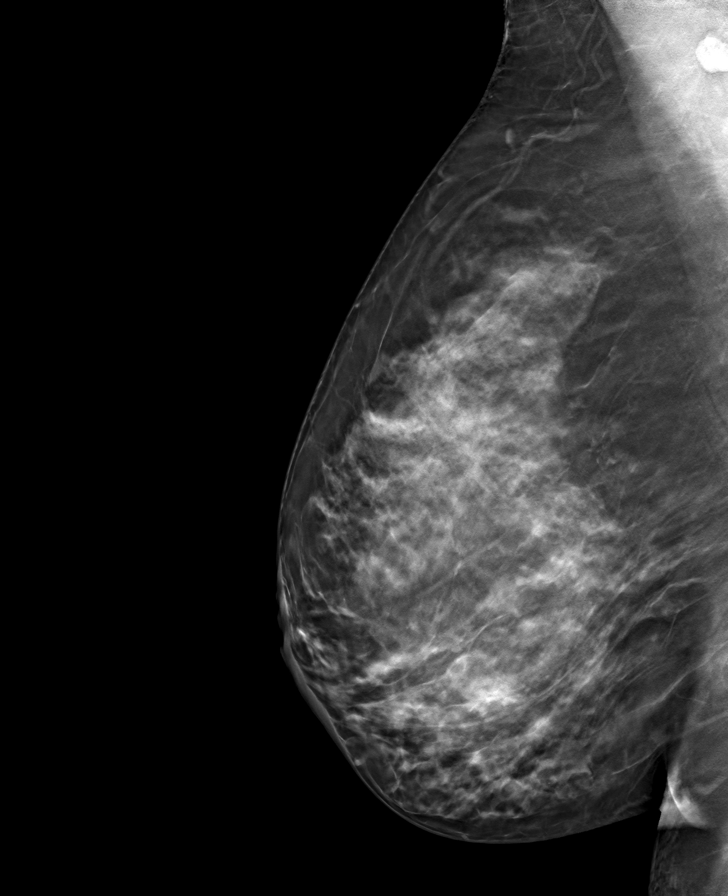

[R CC tomo · tomo slice 38/75.0]
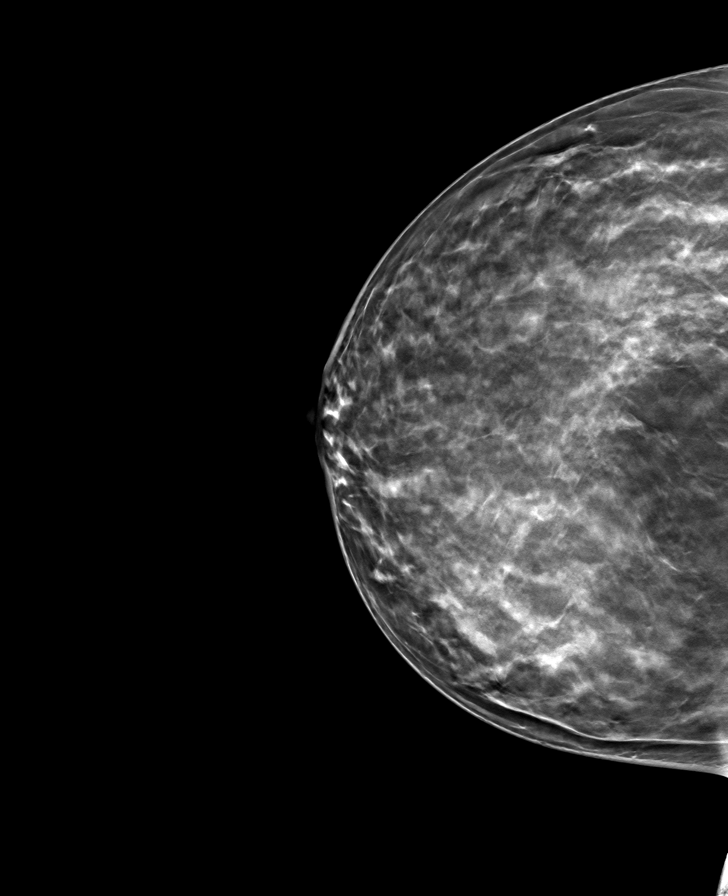

[8 of 24 positions shown; findings below may reference images not displayed]

ACR Breast Density Category c: The breast tissue is heterogeneously
dense, which may obscure small masses.
FINDINGS: There are no findings suspicious for malignancy.
IMPRESSION: No mammographic evidence of malignancy. A result letter of this
screening mammogram will be mailed directly to the patient.

RECOMMENDATION:
Screening mammogram in one year. (Code:Q3-W-BC3)

BI-RADS CATEGORY  1: Negative.

## 2022-05-18 ENCOUNTER — Encounter: Payer: BC Managed Care – PPO | Admitting: Licensed Clinical Social Worker

## 2022-05-18 ENCOUNTER — Other Ambulatory Visit: Payer: BC Managed Care – PPO

## 2022-05-19 ENCOUNTER — Ambulatory Visit
Admission: RE | Admit: 2022-05-19 | Discharge: 2022-05-19 | Disposition: A | Discharge status: Home / Self Care | Payer: BC Managed Care – PPO | Source: Ambulatory Visit | Attending: General Surgery | Admitting: General Surgery

## 2022-05-19 ENCOUNTER — Other Ambulatory Visit: Payer: Self-pay | Admitting: General Surgery

## 2022-05-19 DIAGNOSIS — R928 Other abnormal and inconclusive findings on diagnostic imaging of breast: Secondary | ICD-10-CM

## 2022-05-19 DIAGNOSIS — C50312 Malignant neoplasm of lower-inner quadrant of left female breast: Secondary | ICD-10-CM

## 2022-05-19 MED ORDER — GADOBUTROL 1 MMOL/ML IV SOLN
9.0000 mL | Freq: Once | INTRAVENOUS | Status: AC | PRN
  Administered 2022-05-19: 9 mL via INTRAVENOUS

## 2022-05-27 ENCOUNTER — Other Ambulatory Visit: Payer: Self-pay | Admitting: General Surgery

## 2022-05-27 DIAGNOSIS — N6489 Other specified disorders of breast: Secondary | ICD-10-CM

## 2022-05-28 ENCOUNTER — Other Ambulatory Visit: Payer: Self-pay | Admitting: General Surgery

## 2022-05-28 NOTE — Progress Notes (Signed)
Progress Notes - documented in this encounter Jobanny Mavis, Geronimo Boot, MD - 05/27/2022 8:30 AM EDT Formatting of this note is different from the original. Subjective:   Patient ID: BRITENY [name] is a 61 y.o. female.  HPI  The following portions of the patient's history were reviewed and updated as appropriate.  This an established patient is here today for: office visit. The patient is here today to follow up from a left breast MRI guided biopsy done on 05-19-22.  Patient reports she has met with Dr. Yevette Edwards from the [facility].   Chief Complaint  Patient presents with  Treatment Plan Discussion    BP 130/82  Pulse 75  Temp 36.8 C (98.3 F)  Ht 160 cm (_0 )  Wt 80.3 kg (177 lb)  LMP 10/12/2015  SpO2 96%  BMI 31.35 kg/m   Past Medical History:  Diagnosis Date  Breast cancer (CMS-HCC) 04/21/2022  left  Chicken pox  Esophagitis 03/17/2012  Fibroid, uterine  GERD (gastroesophageal reflux disease)  Hyperlipidemia  Hypertension  Ulcerative proctitis (CMS-HCC)  Uterine leiomyoma, unspecified location 08/21/2014    Past Surgical History:  Procedure Laterality Date  [place]  wisdom tooth extraction 09/14/1991  EGD 03/17/2012  Esophagitis. No repeat.  TAH with bilateral salpingectomy 11/10/2015  COLONOSCOPY 04/07/2018  Chronic colitis/FHx CP/Repeat 57yr/TKT  ultrasound guided core breast biopsy Left 04/21/2022  x 2  mri guided breast biopsy Left 05/19/2022    OB History   Gravida  0  Para  0  Term  0  Preterm  0  AB  0  Living  0    SAB  0  IAB  0  Ectopic  0  Molar   Multiple  0  Live Births     Obstetric Comments  Age at first period 89      Social History   Socioeconomic History  Marital status: Single  Tobacco Use  Smoking status: Never  Smokeless tobacco: Never  Vaping Use  Vaping Use: Never used  Substance and Sexual Activity   Alcohol use: No  Drug use: No  Sexual activity: Yes  Partners: Male  Birth control/protection: Surgical    Allergies  Allergen Reactions  Amoxicillin Diarrhea   Current Outpatient Medications  Medication Sig Dispense Refill  atorvastatin (LIPITOR) 10 MG tablet Take 1 tablet (10 mg total) by mouth once daily 90 tablet 3  Bifidobacterium infantis (ALIGN) 4 mg capsule Take 1 capsule by mouth once daily  bisoproloL-hydroCHLOROthiazide (ZIAC) 5-6.25 mg tablet Take 1 tablet by mouth once daily 90 tablet 3  mesalamine (LIALDA) 1.2 gram EC tablet TAKE 1 TABLET (1.2 G TOTAL) BY MOUTH DAILY WITH BREAKFAST FOR 120 DAYS TAKE ONE TABLET DAILY AS PRESCRIBED. 90 tablet 3   No current facility-administered medications for this visit.   Family History  Problem Relation Age of Onset  Diabetes Mother  High blood pressure (Hypertension) Mother  Stroke Father  Prostate cancer Father  Colon polyps Father  Heart murmur Father  Breast cancer Sister 674 High blood pressure (Hypertension) Sister  Coronary Artery Disease (Blocked arteries around heart) Sister  High blood pressure (Hypertension) Brother  Diabetes Brother  Prediabetes  High blood pressure (Hypertension) Brother  Coronary Artery Disease (Blocked arteries around heart) Brother  Myocardial Infarction (Heart attack) Brother 50  High blood pressure (Hypertension) Brother  Gallbladder disease Maternal Grandmother  Stroke Maternal Grandfather  Diabetes type II Paternal  Grandmother  Stroke Paternal Grandmother  Liver cancer Paternal Grandfather  Breast cancer Cousin 46  Colon cancer Neg Hx   Labs and Radiology:   May 19, 2022 MRI biopsy:  Diagnosis 1. Breast, left, needle core biopsy, lower inner quadrant (barbell clip) - BENIGN BREAST TISSUE WITH NO SIGNIFICANT PATHOLOGY (A PATHOLOGIC CORRELATE TO A LESION IS NOT IDENTIFIED ON THE SECTIONS EXAMINED). 2. Breast, left, needle core biopsy, central (cylinder clip) - COMPLEX  SCLEROSING LESION WITH COLUMNAR CELL CHANGE AND AN ASSOCIATED INCIDENTAL INTRADUCTAL PAPILLOMA (<2 MM). - FOCAL BACKGROUND BREAST WITH ATYPICAL LOBULAR HYPERPLASIA (ALH) WITH PAGETOID GROWTH. - CALCIFICATIONS PRESENT.  Review of Systems  Constitutional: Negative for chills and fever.  Respiratory: Negative for cough.    Objective:  Physical Exam Constitutional:  Appearance: Normal appearance.  Cardiovascular:  Rate and Rhythm: Normal rate and regular rhythm.  Pulses: Normal pulses.  Heart sounds: Normal heart sounds.  Pulmonary:  Effort: Pulmonary effort is normal.  Breath sounds: Normal breath sounds.  Musculoskeletal:  Cervical back: Normal range of motion and neck supple.  Lymphadenopathy:  Upper Body:  Left upper body: No supraclavicular or axillary adenopathy.  Neurological:  Mental Status: She is alert and oriented to person, place, and time.  Psychiatric:  Mood and Affect: Mood normal.  Behavior: Behavior normal.    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  Plan:   The MRI finding of radial scar and ALH raise the possibility of a third primary cancer. [place] and of itself does not mandate excision, but in light of her 2 known cancers and the identification of radial scar I think that she has a higher than average risk of upstaging on formal biopsy. Options for management were reviewed in detail  1) mastectomy, not desired at this time; 2) wire localization biopsy of the central lesion to confirm final pathology and return at a separate date to deal with the 2 known cancers in the lower inner quadrant versus 3) wire local all 3 lesions and excision of the central lesion either through an independent or contiguous incision with known cancers with the possibility of the need for second exploration for margins if an cancer is identified (it will not be possible at the time of any  procedure for the pathologist to confirm invasive or noninvasive cancer or margins).  After a long and thoughtful discussion plans at this time or to complete a surgical biopsy of the central lesion with the cylinder clip where ALH and radial scar is identified and then if no cancer proceed to wide excision of the 2 known cancers and if cancer is identified have a further discussion regarding breast conservation.  Opportunity for second surgical opinion/University referral discussed. The patient and family are aware my plan retirement at the end of the year, but we will anticipate surgical procedures and healing complete will be achieved by that time..  This note is partially prepared by Ledell Noss, CMA acting as a scribe in the presence of Dr. Hervey Ard, MD.   The documentation recorded by the scribe accurately reflects the service I personally performed and the decisions made by me.   Robert Bellow, MD FACS   Electronically signed by Mayer Masker, MD at 05/27/2022 9:20 AM EDT

## 2022-05-31 ENCOUNTER — Ambulatory Visit: Payer: BC Managed Care – PPO | Admitting: Internal Medicine

## 2022-06-03 ENCOUNTER — Encounter
Admission: RE | Admit: 2022-06-03 | Discharge: 2022-06-03 | Disposition: A | Discharge status: Home / Self Care | Payer: BC Managed Care – PPO | Source: Ambulatory Visit | Attending: General Surgery | Admitting: General Surgery

## 2022-06-03 VITALS — Ht 63.5 in | Wt 175.0 lb

## 2022-06-03 DIAGNOSIS — I10 Essential (primary) hypertension: Secondary | ICD-10-CM

## 2022-06-03 HISTORY — DX: Malignant neoplasm of unspecified site of unspecified female breast: C50.919

## 2022-06-03 HISTORY — DX: Hyperlipidemia, unspecified: E78.5

## 2022-06-03 HISTORY — DX: Personal history of other diseases of the digestive system: Z87.19

## 2022-06-03 HISTORY — DX: Ulcerative colitis, unspecified, without complications: K51.90

## 2022-06-03 NOTE — Patient Instructions (Signed)
Your procedure is scheduled on: 06/09/22 Report to [facility] at scheduled time .  Remember: Instructions that are not followed completely may result in serious medical risk, up to and including death, or upon the discretion of your surgeon and anesthesiologist your surgery may need to be rescheduled.     _X__ 1. Do not eat food after midnight the night before your procedure.                 No gum chewing or hard candies. You may drink clear liquids up to 2 hours                 before you are scheduled to arrive for your surgery- DO not drink clear                 liquids within 2 hours of the start of your surgery.                 Clear Liquids include:  water, apple juice without pulp, clear carbohydrate                 drink such as Clearfast or Gatorade, Black Coffee or Tea (Do not add                 anything to coffee or tea). Diabetics water only  __X__2.  On the morning of surgery brush your teeth with toothpaste and water, you                 may rinse your mouth with mouthwash if you wish.  Do not swallow any              toothpaste of mouthwash.     _X__ 3.  No Alcohol for 24 hours before or after surgery.   _X__ 4.  Do Not Smoke or use e-cigarettes For 24 Hours Prior to Your Surgery.                 Do not use any chewable tobacco products for at least 6 hours prior to                 surgery.  ____  5.  Bring all medications with you on the day of surgery if instructed.   __X__  6.  Notify your doctor if there is any change in your medical condition      (cold, fever, infections).     Do not wear jewelry, make-up, hairpins, clips or nail polish. Do not wear lotions, powders, or perfumes. No deodorant Do not shave body hair 48 hours prior to surgery. Men may shave face and neck. Do not bring valuables to the hospital.    [facility] is not responsible for any belongings or valuables.  Contacts, dentures/partials or body piercings may not be worn into  surgery. Bring a case for your contacts, glasses or hearing aids, a denture cup will be supplied. Leave your suitcase in the car. After surgery it may be brought to your room. For patients admitted to the hospital, discharge time is determined by your treatment team.   Patients discharged the day of surgery will not be allowed to drive home.   Please read over the following fact sheets that you were given:   MRSA Information, CHG soap  __X__ Take these medicines the morning of surgery with A SIP OF WATER:    1. mesalamine (LIALDA) 1.2 g EC tablet  2. Probiotic Product (ALIGN) 4  MG CAPS  3.   4.  5.  6.  ____ Fleet Enema (as directed)   __X__ Use CHG Soap/SAGE wipes as directed  ____ Use inhalers on the day of surgery  ____ Stop metformin/Janumet/Farxiga 2 days prior to surgery    ____ Take 1/2 of usual insulin dose the night before surgery. No insulin the morning          of surgery.   ____ Stop Blood Thinners Coumadin/Plavix/Xarelto/Pleta/Pradaxa/Eliquis/Effient/Aspirin  on   Or contact your Surgeon, Cardiologist or Medical Doctor regarding  ability to stop your blood thinners  __X__ Stop Anti-inflammatories 7 days before surgery such as Advil, Ibuprofen, Motrin,  BC or Goodies Powder, Naprosyn, Naproxen, Aleve, Aspirin   May have Tylenol if needed  __X__ Stop all herbals and supplements, fish oil or vitamins  until after surgery.    ____ Bring C-Pap to the hospital.

## 2022-06-04 ENCOUNTER — Encounter: Payer: Self-pay | Admitting: Urgent Care

## 2022-06-04 ENCOUNTER — Encounter
Admission: RE | Admit: 2022-06-04 | Discharge: 2022-06-04 | Disposition: A | Discharge status: Home / Self Care | Payer: BC Managed Care – PPO | Source: Ambulatory Visit | Attending: General Surgery | Admitting: General Surgery

## 2022-06-04 DIAGNOSIS — I10 Essential (primary) hypertension: Secondary | ICD-10-CM | POA: Diagnosis not present

## 2022-06-04 DIAGNOSIS — I498 Other specified cardiac arrhythmias: Secondary | ICD-10-CM | POA: Insufficient documentation

## 2022-06-04 DIAGNOSIS — Z01810 Encounter for preprocedural cardiovascular examination: Secondary | ICD-10-CM | POA: Diagnosis present

## 2022-06-08 MED ORDER — ORAL CARE MOUTH RINSE: 15.0000 mL | Freq: Once | OROMUCOSAL | Status: AC

## 2022-06-08 MED ORDER — CHLORHEXIDINE GLUCONATE CLOTH 2 % EX PADS
6.0000 | MEDICATED_PAD | Freq: Once | CUTANEOUS | Status: AC
  Administered 2022-06-09: 6 via TOPICAL

## 2022-06-08 MED ORDER — FAMOTIDINE 20 MG PO TABS
20.0000 mg | ORAL_TABLET | Freq: Once | ORAL | Status: AC
  Administered 2022-06-09: 20 mg via ORAL

## 2022-06-08 MED ORDER — LACTATED RINGERS IV SOLN: INTRAVENOUS | Status: DC

## 2022-06-08 MED ORDER — CHLORHEXIDINE GLUCONATE 0.12 % MT SOLN
15.0000 mL | Freq: Once | OROMUCOSAL | Status: AC
  Administered 2022-06-09: 15 mL via OROMUCOSAL

## 2022-06-09 ENCOUNTER — Ambulatory Visit: Payer: BC Managed Care – PPO | Admitting: Certified Registered"

## 2022-06-09 ENCOUNTER — Ambulatory Visit: Payer: BC Managed Care – PPO | Admitting: Urgent Care

## 2022-06-09 ENCOUNTER — Encounter
Admission: RE | Disposition: A | Discharge status: Home / Self Care | Payer: Self-pay | Source: Home / Self Care | Attending: General Surgery

## 2022-06-09 ENCOUNTER — Ambulatory Visit
Admission: RE | Admit: 2022-06-09 | Discharge: 2022-06-09 | Disposition: A | Discharge status: Home / Self Care | Payer: BC Managed Care – PPO | Source: Ambulatory Visit | Attending: General Surgery | Admitting: General Surgery

## 2022-06-09 ENCOUNTER — Other Ambulatory Visit: Payer: Self-pay

## 2022-06-09 ENCOUNTER — Ambulatory Visit
Admission: RE | Admit: 2022-06-09 | Discharge: 2022-06-09 | Disposition: A | Discharge status: Home / Self Care | Payer: BC Managed Care – PPO | Attending: General Surgery | Admitting: General Surgery

## 2022-06-09 ENCOUNTER — Encounter: Payer: Self-pay | Admitting: General Surgery

## 2022-06-09 DIAGNOSIS — K449 Diaphragmatic hernia without obstruction or gangrene: Secondary | ICD-10-CM | POA: Insufficient documentation

## 2022-06-09 DIAGNOSIS — K219 Gastro-esophageal reflux disease without esophagitis: Secondary | ICD-10-CM | POA: Insufficient documentation

## 2022-06-09 DIAGNOSIS — N6489 Other specified disorders of breast: Secondary | ICD-10-CM

## 2022-06-09 DIAGNOSIS — L905 Scar conditions and fibrosis of skin: Secondary | ICD-10-CM | POA: Insufficient documentation

## 2022-06-09 DIAGNOSIS — I10 Essential (primary) hypertension: Secondary | ICD-10-CM | POA: Insufficient documentation

## 2022-06-09 DIAGNOSIS — N6092 Unspecified benign mammary dysplasia of left breast: Secondary | ICD-10-CM | POA: Insufficient documentation

## 2022-06-09 HISTORY — PX: BREAST BIOPSY: SHX20

## 2022-06-09 SURGERY — BREAST BIOPSY WITH NEEDLE LOCALIZATION
Anesthesia: General | Site: Breast | Laterality: Left

## 2022-06-09 MED ORDER — LIDOCAINE HCL (CARDIAC) PF 100 MG/5ML IV SOSY
PREFILLED_SYRINGE | INTRAVENOUS | Status: DC | PRN
  Administered 2022-06-09: 100 mg via INTRAVENOUS

## 2022-06-09 MED ORDER — FENTANYL CITRATE (PF) 100 MCG/2ML IJ SOLN
INTRAMUSCULAR | Status: AC
  Filled 2022-06-09: qty 2

## 2022-06-09 MED ORDER — EPHEDRINE 5 MG/ML INJ
INTRAVENOUS | Status: AC
  Filled 2022-06-09: qty 5

## 2022-06-09 MED ORDER — ONDANSETRON HCL 4 MG/2ML IJ SOLN
INTRAMUSCULAR | Status: DC | PRN
  Administered 2022-06-09: 4 mg via INTRAVENOUS

## 2022-06-09 MED ORDER — ONDANSETRON HCL 4 MG/2ML IJ SOLN
INTRAMUSCULAR | Status: AC
  Filled 2022-06-09: qty 2

## 2022-06-09 MED ORDER — BUPIVACAINE-EPINEPHRINE (PF) 0.5% -1:200000 IJ SOLN
INTRAMUSCULAR | Status: AC
  Filled 2022-06-09: qty 30

## 2022-06-09 MED ORDER — FAMOTIDINE 20 MG PO TABS
ORAL_TABLET | ORAL | Status: AC
  Filled 2022-06-09: qty 1

## 2022-06-09 MED ORDER — EPHEDRINE SULFATE (PRESSORS) 50 MG/ML IJ SOLN
INTRAMUSCULAR | Status: DC | PRN
  Administered 2022-06-09 (×2): 10 mg via INTRAVENOUS
  Administered 2022-06-09: 5 mg via INTRAVENOUS

## 2022-06-09 MED ORDER — CHLORHEXIDINE GLUCONATE 0.12 % MT SOLN
OROMUCOSAL | Status: AC
  Filled 2022-06-09: qty 15

## 2022-06-09 MED ORDER — MIDAZOLAM HCL 2 MG/2ML IJ SOLN
INTRAMUSCULAR | Status: AC
  Filled 2022-06-09: qty 2

## 2022-06-09 MED ORDER — FENTANYL CITRATE (PF) 100 MCG/2ML IJ SOLN
INTRAMUSCULAR | Status: DC | PRN
  Administered 2022-06-09: 15 ug via INTRAVENOUS
  Administered 2022-06-09: 25 ug via INTRAVENOUS
  Administered 2022-06-09: 10 ug via INTRAVENOUS
  Administered 2022-06-09: 15 ug via INTRAVENOUS
  Administered 2022-06-09: 25 ug via INTRAVENOUS
  Administered 2022-06-09: 10 ug via INTRAVENOUS

## 2022-06-09 MED ORDER — ACETAMINOPHEN 10 MG/ML IV SOLN
INTRAVENOUS | Status: DC | PRN
  Administered 2022-06-09: 1000 mg via INTRAVENOUS

## 2022-06-09 MED ORDER — DEXAMETHASONE SODIUM PHOSPHATE 10 MG/ML IJ SOLN
INTRAMUSCULAR | Status: DC | PRN
  Administered 2022-06-09: 10 mg via INTRAVENOUS

## 2022-06-09 MED ORDER — HYDROCODONE-ACETAMINOPHEN 5-325 MG PO TABS: 1.0000 | ORAL_TABLET | ORAL | 0 refills | Status: DC | PRN

## 2022-06-09 MED ORDER — PROPOFOL 10 MG/ML IV BOLUS
INTRAVENOUS | Status: AC
  Filled 2022-06-09: qty 20

## 2022-06-09 MED ORDER — MIDAZOLAM HCL 2 MG/2ML IJ SOLN
INTRAMUSCULAR | Status: DC | PRN
  Administered 2022-06-09: 2 mg via INTRAVENOUS

## 2022-06-09 MED ORDER — PHENYLEPHRINE 80 MCG/ML (10ML) SYRINGE FOR IV PUSH (FOR BLOOD PRESSURE SUPPORT)
PREFILLED_SYRINGE | INTRAVENOUS | Status: AC
  Filled 2022-06-09: qty 10

## 2022-06-09 MED ORDER — DEXAMETHASONE SODIUM PHOSPHATE 10 MG/ML IJ SOLN
INTRAMUSCULAR | Status: AC
  Filled 2022-06-09: qty 1

## 2022-06-09 MED ORDER — LIDOCAINE HCL (PF) 2 % IJ SOLN
INTRAMUSCULAR | Status: AC
  Filled 2022-06-09: qty 5

## 2022-06-09 MED ORDER — BUPIVACAINE-EPINEPHRINE (PF) 0.5% -1:200000 IJ SOLN
INTRAMUSCULAR | Status: DC | PRN
  Administered 2022-06-09: 20 mL
  Administered 2022-06-09: 10 mL

## 2022-06-09 MED ORDER — PHENYLEPHRINE HCL (PRESSORS) 10 MG/ML IV SOLN
INTRAVENOUS | Status: DC | PRN
  Administered 2022-06-09 (×5): 80 ug via INTRAVENOUS

## 2022-06-09 MED ORDER — PROPOFOL 10 MG/ML IV BOLUS
INTRAVENOUS | Status: DC | PRN
  Administered 2022-06-09: 160 mg via INTRAVENOUS

## 2022-06-09 MED ORDER — ACETAMINOPHEN 10 MG/ML IV SOLN
INTRAVENOUS | Status: AC
  Filled 2022-06-09: qty 100

## 2022-06-09 SURGICAL SUPPLY — 49 items
APL PRP STRL LF DISP 70% ISPRP (MISCELLANEOUS) ×1
BINDER BREAST XLRG (GAUZE/BANDAGES/DRESSINGS) IMPLANT
BLADE BOVIE TIP EXT 4 (BLADE) IMPLANT
BLADE SURG 15 STRL SS SAFETY (BLADE) ×2 IMPLANT
BNDG GAUZE DERMACEA FLUFF 4 (GAUZE/BANDAGES/DRESSINGS) IMPLANT
BNDG GZE DERMACEA 4 6PLY (GAUZE/BANDAGES/DRESSINGS) ×1
CHLORAPREP W/TINT 26 (MISCELLANEOUS) ×2 IMPLANT
CLIP TI MEDIUM 6 (CLIP) IMPLANT
CNTNR SPEC 2.5X3XGRAD LEK (MISCELLANEOUS)
CONT SPEC 4OZ STER OR WHT (MISCELLANEOUS)
CONT SPEC 4OZ STRL OR WHT (MISCELLANEOUS)
CONTAINER SPEC 2.5X3XGRAD LEK (MISCELLANEOUS) IMPLANT
COVER PROBE FLX POLY STRL (MISCELLANEOUS) ×1 IMPLANT
DEVICE DUBIN SPECIMEN MAMMOGRA (MISCELLANEOUS) ×1 IMPLANT
DRAPE LAPAROTOMY TRNSV 106X77 (MISCELLANEOUS) ×1 IMPLANT
DRSG GAUZE FLUFF 36X18 (GAUZE/BANDAGES/DRESSINGS) ×1 IMPLANT
DRSG TELFA 3X4 N-ADH STERILE (GAUZE/BANDAGES/DRESSINGS) ×2 IMPLANT
DRSG TELFA 3X8 NADH STRL (GAUZE/BANDAGES/DRESSINGS) IMPLANT
ELECT CAUTERY BLADE TIP 2.5 (TIP) ×1
ELECT REM PT RETURN 9FT ADLT (ELECTROSURGICAL) ×1
ELECTRODE CAUTERY BLDE TIP 2.5 (TIP) ×1 IMPLANT
ELECTRODE REM PT RTRN 9FT ADLT (ELECTROSURGICAL) ×1 IMPLANT
GAUZE 4X4 16PLY ~~LOC~~+RFID DBL (SPONGE) ×1 IMPLANT
GLOVE BIO SURGEON STRL SZ7.5 (GLOVE) ×1 IMPLANT
GLOVE SURG UNDER LTX SZ8 (GLOVE) ×1 IMPLANT
GOWN STRL REUS W/ TWL LRG LVL3 (GOWN DISPOSABLE) ×2 IMPLANT
GOWN STRL REUS W/TWL LRG LVL3 (GOWN DISPOSABLE) ×2
KIT TURNOVER KIT A (KITS) ×1 IMPLANT
LABEL OR SOLS (LABEL) ×1 IMPLANT
MANIFOLD NEPTUNE II (INSTRUMENTS) ×1 IMPLANT
MARGIN MAP 10MM (MISCELLANEOUS) ×1 IMPLANT
NDL HYPO 25X1 1.5 SAFETY (NEEDLE) ×1 IMPLANT
NEEDLE HYPO 22GX1.5 SAFETY (NEEDLE) ×1 IMPLANT
NEEDLE HYPO 25X1 1.5 SAFETY (NEEDLE) ×1 IMPLANT
PACK BASIN MINOR ARMC (MISCELLANEOUS) ×1 IMPLANT
RETRACTOR RING XSMALL (MISCELLANEOUS) IMPLANT
RTRCTR WOUND ALEXIS 13CM XS SH (MISCELLANEOUS) ×1
STRIP CLOSURE SKIN 1/2X4 (GAUZE/BANDAGES/DRESSINGS) ×1 IMPLANT
SUT ETHILON 3-0 FS-10 30 BLK (SUTURE) ×1
SUT VIC AB 2-0 CT1 27 (SUTURE) ×1
SUT VIC AB 2-0 CT1 TAPERPNT 27 (SUTURE) ×1 IMPLANT
SUT VIC AB 4-0 FS2 27 (SUTURE) ×1 IMPLANT
SUTURE EHLN 3-0 FS-10 30 BLK (SUTURE) ×1 IMPLANT
SWABSTK COMLB BENZOIN TINCTURE (MISCELLANEOUS) ×1 IMPLANT
SYR 10ML LL (SYRINGE) ×1 IMPLANT
TAPE TRANSPORE STRL 2 31045 (GAUZE/BANDAGES/DRESSINGS) IMPLANT
TRAP FLUID SMOKE EVACUATOR (MISCELLANEOUS) ×1 IMPLANT
WATER STERILE IRR 1000ML POUR (IV SOLUTION) ×1 IMPLANT
WATER STERILE IRR 500ML POUR (IV SOLUTION) ×1 IMPLANT

## 2022-06-09 NOTE — Transfer of Care (Signed)
Immediate Anesthesia Transfer of Care Note  Patient: [name]  Procedure(s) Performed: BREAST BIOPSY WITH NEEDLE LOCALIZATION (Left: Breast)  Patient Location: PACU  Anesthesia Type:General  Level of Consciousness: awake, alert  and oriented  Airway & Oxygen Therapy: Patient Spontanous Breathing and Patient connected to face mask oxygen  Post-op Assessment: Report given to RN and Post -op Vital signs reviewed and stable  Post vital signs: stable  Last Vitals:  Vitals Value Taken Time  BP 108/61 06/09/22 1237  Temp    Pulse 88 06/09/22 1240  Resp    SpO2 100 % 06/09/22 1240  Vitals shown include unvalidated device data.  Last Pain:  Vitals:   06/09/22 1110  TempSrc: Temporal  PainSc: 0-No pain         Complications: No notable events documented.

## 2022-06-09 NOTE — Anesthesia Procedure Notes (Signed)
Procedure Name: Intubation Date/Time: 06/09/2022 11:33 AM  Performed by: Natasha Mead, CRNAPre-anesthesia Checklist: Patient identified, Emergency Drugs available, Suction available and Patient being monitored Patient Re-evaluated:Patient Re-evaluated prior to induction Oxygen Delivery Method: Circle system utilized Preoxygenation: Pre-oxygenation with 100% oxygen Induction Type: IV induction Ventilation: Mask ventilation without difficulty LMA: LMA inserted LMA Size: 4.0 Number of attempts: 1 Airway Equipment and Method: Oral airway Placement Confirmation: breath sounds checked- equal and bilateral and positive ETCO2 Tube secured with: Tape Dental Injury: Teeth and Oropharynx as per pre-operative assessment

## 2022-06-09 NOTE — Discharge Instructions (Signed)
AMBULATORY SURGERY  DISCHARGE INSTRUCTIONS   The drugs that you were given will stay in your system until tomorrow so for the next 24 hours you should not:  Drive an automobile Make any legal decisions Drink any alcoholic beverage   You may resume regular meals tomorrow.  Today it is better to start with liquids and gradually work up to solid foods.  You may eat anything you prefer, but it is better to start with liquids, then soup and crackers, and gradually work up to solid foods.   Please notify your doctor immediately if you have any unusual bleeding, trouble breathing, redness and pain at the surgery site, drainage, fever, or pain not relieved by medication.       Please contact your physician with any problems or Same Day Surgery at [phone], Monday through Friday 6 am to 4 pm, or  at [facility] number at [phone].

## 2022-06-09 NOTE — Anesthesia Preprocedure Evaluation (Signed)
Anesthesia Evaluation  Patient identified by MRN, date of birth, ID band Patient awake    Reviewed: Allergy & Precautions, H&P , NPO status , Patient's Chart, lab work & pertinent test results, reviewed documented beta blocker date and time   Airway Mallampati: II  TM Distance: >3 FB Neck ROM: full    Dental  (+) Teeth Intact   Pulmonary neg pulmonary ROS,    Pulmonary exam normal        Cardiovascular Exercise Tolerance: Good hypertension, On Medications negative cardio ROS Normal cardiovascular exam Rate:Normal     Neuro/Psych negative neurological ROS  negative psych ROS   GI/Hepatic Neg liver ROS, hiatal hernia, PUD, GERD  Medicated,  Endo/Other  negative endocrine ROS  Renal/GU negative Renal ROS  negative genitourinary   Musculoskeletal   Abdominal   Peds  Hematology negative hematology ROS (+)   Anesthesia Other Findings   Reproductive/Obstetrics negative OB ROS                             Anesthesia Physical Anesthesia Plan  ASA: 2  Anesthesia Plan: General LMA   Post-op Pain Management:    Induction:   PONV Risk Score and Plan:   Airway Management Planned:   Additional Equipment:   Intra-op Plan:   Post-operative Plan:   Informed Consent: I have reviewed the patients History and Physical, chart, labs and discussed the procedure including the risks, benefits and alternatives for the proposed anesthesia with the patient or authorized representative who has indicated his/her understanding and acceptance.       Plan Discussed with: CRNA  Anesthesia Plan Comments:         Anesthesia Quick Evaluation

## 2022-06-09 NOTE — H&P (Signed)
[name] [id number] 12-Jul-1961     HPI:  60 y/o woman with recently identified left breast radial scar with associated ALH. For excisional biopsy.   Medications Prior to Admission  Medication Sig Dispense Refill Last Dose   atorvastatin (LIPITOR) 10 MG tablet Take 10 mg by mouth every evening.   06/08/2022   bisoprolol-hydrochlorothiazide (ZIAC) 5-6.25 MG tablet Take 1 tablet by mouth every morning.   06/08/2022   mesalamine (LIALDA) 1.2 g EC tablet TAKE 1 TABLET (1.2 G TOTAL) BY MOUTH DAILY WITH BREAKFAST FOR 120 DAYS TAKE ONE TABLET DAILY AS PRESCRIBED.   06/08/2022   Probiotic Product (ALIGN) 4 MG CAPS Take 1 capsule by mouth daily.   06/09/2022   Allergies  Allergen Reactions   Amoxicillin Diarrhea   Past Medical History:  Diagnosis Date   Breast cancer ([place])    left   GERD (gastroesophageal reflux disease)    History of hiatal hernia    HLD (hyperlipidemia)    Hypertension    Ulcerative colitis ([place])    Past Surgical History:  Procedure Laterality Date   ABDOMINAL HYSTERECTOMY     BREAST BIOPSY Left 04/21/2022   [place] bx, path pending, ribbon marker   BREAST BIOPSY Left 04/21/2022   [place] bx, path pending, coil marker   EYE MUSCLE SURGERY Left 09/14/1967   FRACTURE SURGERY Left 09/13/1972   arm   MANDIBLE SURGERY Bilateral 09/13/1977   UPPER GI ENDOSCOPY  09/13/2012   stretching of esophagus   WISDOM TOOTH EXTRACTION  09/14/1991   3 wisdom teeth   Social History   Socioeconomic History   Marital status: Single    Spouse name: Not on file   Number of children: Not on file   Years of education: Not on file   Highest education level: Not on file  Occupational History   Not on file  Tobacco Use   Smoking status: Never   Smokeless tobacco: Not on file  Vaping Use   Vaping Use: Never used  Substance and Sexual Activity   Alcohol use: No   Drug use: No   Sexual activity: Not on file  Other Topics Concern   Not on file  Social History Narrative   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      ROS: Negative.   2. Breast, left, needle core biopsy, central (cylinder clip) - COMPLEX SCLEROSING LESION WITH COLUMNAR CELL CHANGE AND AN ASSOCIATED INCIDENTAL INTRADUCTAL PAPILLOMA (<2 MM). - FOCAL BACKGROUND BREAST WITH ATYPICAL LOBULAR HYPERPLASIA (ALH) WITH PAGETOID GROWTH. - CALCIFICATIONS PRESENT.  PE: HEENT: Negative. Lungs: Clear. Cardio: RR.   Assessment/Plan:  Proceed with planned wire localization and biopsy.  Forest Gleason [facility] 06/09/2022

## 2022-06-09 NOTE — Op Note (Addendum)
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  Postoperative diagnosis: Same.  Operative procedure: Wire and ultrasound localization of the left breast lesion.  Operating surgeon: Hervey Ard, MD.  Anesthesia: General by LMA, [place] 0.5% with 1: 200,000 notes of epinephrine, 30 cc.  Estimated blood loss: Less than 5 cc.  Clinical note: This 61 year old woman has had multiple breast biopsies.  Most recent 1 showed a area of radial scar with associated atypical lobular hyperplasia.  She was felt to be a candidate for local excision.  She underwent wire localization the morning of the procedure.  The lesion was approximately 9 cm from the skin entrance site.  Operative note: The patient underwent general anesthesia by LMA without difficulty.  The breast was carefully prepped with ChloraPrep and draped.  Ultrasound was used to identify the anticipated biopsy site just outside the edge of the areola at the 1-2 o'clock position, about 7 cm from the needle entrance site.  The Ace's a curvilinear incision in the upper outer quadrant of the breast was made after instillation of local anesthesia.  Skin was incised sharply remaining dissection with electrocautery.  The adipose tissue was elevated off the underlying dense breast parenchyma.  The localizing wire was identified and brought into the wound after placement of an [place] wound protector.  A block of tissue 3 x 3 x 6 cm in length was excised orientated and specimen radiograph confirmed the cylinder clip in the center of the specimen as well as an intact wire.  This was sent for routine histology.  Hemostasis was electrocautery.  Two medium Hemaclips were placed at the medial margin of the resection for future identification.  The deep tissue was approximated in multiple layers with 2-0 Vicryl suture.  The adipose layer was approximated similar fashion.  The skin was closed with  a running 4-0 Vicryl subcuticular suture.  Benzoin and Steri-Strips followed by Telfa, fluff gauze and a compressive wrap were applied.  Patient tolerated procedure well and was taken to the PACU in stable condition.

## 2022-06-10 ENCOUNTER — Encounter: Payer: Self-pay | Admitting: General Surgery

## 2022-06-11 LAB — SURGICAL PATHOLOGY

## 2022-06-11 NOTE — Anesthesia Postprocedure Evaluation (Signed)
Anesthesia Post Note  Patient: [name]  Procedure(s) Performed: BREAST BIOPSY WITH NEEDLE LOCALIZATION (Left: Breast)  Patient location during evaluation: PACU Anesthesia Type: General Level of consciousness: awake and alert Pain management: pain level controlled Vital Signs Assessment: post-procedure vital signs reviewed and stable Respiratory status: spontaneous breathing, nonlabored ventilation, respiratory function stable and patient connected to nasal cannula oxygen Cardiovascular status: blood pressure returned to baseline and stable Postop Assessment: no apparent nausea or vomiting Anesthetic complications: no   No notable events documented.   Last Vitals:  Vitals:   06/09/22 1315 06/09/22 1330  BP: 122/70 118/71  Pulse: 78 74  Resp: 17 18  Temp: (!) 36.3 C (!) 36.3 C  SpO2: 96% 96%    Last Pain:  Vitals:   06/10/22 0928  TempSrc:   PainSc: [place] Georgiana Spillane

## 2022-06-17 ENCOUNTER — Other Ambulatory Visit: Payer: Self-pay | Admitting: General Surgery

## 2022-06-17 DIAGNOSIS — C50312 Malignant neoplasm of lower-inner quadrant of left female breast: Secondary | ICD-10-CM

## 2022-06-17 NOTE — Progress Notes (Signed)
Progress Notes - documented in this encounter [name], Jamie Boot, MD - 06/17/2022 1:45 PM EDT Formatting of this note is different from the original. Images from the original note were not included. Subjective:   Patient ID: [name] is a 61 y.o. female.  HPI  The following portions of the patient's history were reviewed and updated as appropriate.  This an established patient is here today for: office visit. Patient is here today to follow up from a left breast biopsy with wire localization done on 06-09-22. She is also here to discuss breast cancer surgery options. The patient reports she does have itching near the biopsy site, but no other breast concerns.   She wears a 42 D bra size.  Chief Complaint  Patient presents with  Post Operative Visit    BP 132/84  Pulse 68  Temp 36.7 C (98.1 F)  Ht 160 cm (_0 )  Wt 80.3 kg (177 lb)  LMP 10/12/2015  SpO2 97%  BMI 31.35 kg/m   Past Medical History:  Diagnosis Date  Breast cancer (CMS-HCC) 04/21/2022  left  Chicken pox  Esophagitis 03/17/2012  Fibroid, uterine  GERD (gastroesophageal reflux disease)  Hyperlipidemia  Hypertension  Ulcerative proctitis (CMS-HCC)  Uterine leiomyoma, unspecified location 08/21/2014    Past Surgical History:  Procedure Laterality Date  [place]  wisdom tooth extraction 09/14/1991  EGD 03/17/2012  Esophagitis. No repeat.  TAH with bilateral salpingectomy 11/10/2015  COLONOSCOPY 04/07/2018  Chronic colitis/FHx CP/Repeat 18yr/TKT  ultrasound guided core breast biopsy Left 04/21/2022  x 2  mri guided breast biopsy Left 05/19/2022  breast biopsy with wire localization Left 06/09/2022    OB History   Gravida  0  Para  0  Term  0  Preterm  0  AB  0  Living  0    SAB  0  IAB  0  Ectopic  0  Molar   Multiple  0  Live Births     Obstetric Comments  Age at first period  89      Social History   Socioeconomic History  Marital status: Single  Tobacco Use  Smoking status: Never  Smokeless tobacco: Never  Vaping Use  Vaping Use: Never used  Substance and Sexual Activity  Alcohol use: No  Drug use: No  Sexual activity: Yes  Partners: Male  Birth control/protection: Surgical    Allergies  Allergen Reactions  Amoxicillin Diarrhea   Current Outpatient Medications  Medication Sig Dispense Refill  atorvastatin (LIPITOR) 10 MG tablet Take 1 tablet (10 mg total) by mouth once daily 90 tablet 3  Bifidobacterium infantis (ALIGN) 4 mg capsule Take 1 capsule by mouth once daily  bisoproloL-hydroCHLOROthiazide (ZIAC) 5-6.25 mg tablet Take 1 tablet by mouth once daily 90 tablet 3  mesalamine (LIALDA) 1.2 gram EC tablet TAKE 1 TABLET (1.2 G TOTAL) BY MOUTH DAILY WITH BREAKFAST FOR 120 DAYS TAKE ONE TABLET DAILY AS PRESCRIBED. 90 tablet 3   No current facility-administered medications for this visit.   Family History  Problem Relation Age of Onset  Diabetes Mother  High blood pressure (Hypertension) Mother  Stroke Father  Prostate cancer Father  Colon polyps Father  Heart murmur Father  Breast cancer Sister 666 High blood pressure (Hypertension) Sister  Coronary Artery Disease (Blocked arteries around heart) Sister  High blood pressure (Hypertension) Brother  Diabetes Brother  Prediabetes  High blood pressure (Hypertension) Brother  Coronary Artery Disease (Blocked arteries around heart) Brother  Myocardial Infarction (Heart attack) Brother 28  High blood pressure (Hypertension) Brother  Gallbladder disease Maternal Grandmother  Stroke Maternal Grandfather  Diabetes type II Paternal Grandmother  Stroke Paternal Grandmother  Liver cancer Paternal Grandfather  Breast cancer Cousin 55  Colon cancer Neg Hx   Labs and Radiology:   Left breast wire localized biopsy pathology June 09, 2022:  DIAGNOSIS:  A. BREAST, LEFT UPPER OUTER  QUADRANT; NEEDLE LOCALIZATION AND EXCISION:  - COMPLEX SCLEROSING LESION/RADIAL SCAR.  - ATYPICAL LOBULAR HYPERPLASIA.  - CYLINDRICAL BIOPSY CLIP AND BIOPSY SITE IDENTIFIED.  - INTRADUCTAL PAPILLOMA, BENIGN (3).  - MARGINS NEGATIVE FOR ATYPIA AND MALIGNANCY  Left breast lower outer quadrant biopsy x2:  A. BREAST, LEFT, 8:00; CORE BIOPSIES:   - INVASIVE MAMMARY CARCINOMA, NO SPECIAL TYPE.   Size of invasive carcinoma: 11.5 mm in this sample   A. LEFT BREAST, 8:00; CORE BIOPSIES:   CASE SUMMARY: BREAST BIOMARKER TESTS  Estrogen Receptor (ER) Status: POSITIVE  Percentage of cells with nuclear positivity: Greater than 90%  Average intensity of staining: Strong   Progesterone Receptor (PgR) Status: POSITIVE  Percentage of cells with nuclear positivity: 51 to 90%  Average intensity of staining: Strong   HER2 (by immunohistochemistry): NEGATIVE (Score 0)  Ki-67: Not performed   B. BREAST, LEFT, 9:00; BIOPSIES:   - INVASIVE MAMMARY CARCINOMA, NO SPECIAL TYPE.   Size of invasive carcinoma: 4.5 mm in this sample  Histologic grade of invasive carcinoma: Grade 1 (of 3)  Review of Systems  Constitutional: Negative for chills and fever.  Respiratory: Negative for cough.    Objective:  Physical Exam Exam conducted with a chaperone present.  Constitutional:  Appearance: Normal appearance.  HENT:  Head: Normocephalic.  Cardiovascular:  Rate and Rhythm: Normal rate and regular rhythm.  Pulmonary:  Effort: Pulmonary effort is normal.  Breath sounds: Normal breath sounds.  Chest:  Breasts: Right: Normal.   Comments: Mild bruising from local anesthetic.  Biopsy site in the upper outer quadrant healing well. No evidence of hematoma. Musculoskeletal:  Cervical back: Normal range of motion.  Lymphadenopathy:  Upper Body:  Right upper body: No supraclavicular or axillary adenopathy.  Left upper body: No supraclavicular or axillary adenopathy.  Neurological:  Mental Status:  She is alert and oriented to person, place, and time.  Psychiatric:  Mood and Affect: Mood normal.  Behavior: Behavior normal.  Thought Content: Thought content normal.  Judgment: Judgment normal.    Assessment:   Multifocal versus larger tumor lower inner quadrant left breast.  Radial scar upper outer quadrant of the left breast.  Desire for breast conservation.  Plan:   We reviewed the need to obtain clear margins from the tumor in the lower inner quadrant. Bracketing wires will be required to identify the 2 prior biopsy sites. The patient is aware that especially with large volume resections there is about a 15% chance of need for additional surgery to obtain clear margins.  Role of sentinel node biopsy was reviewed.  We will plan for surgery in the near future.   This note is partially prepared by Ledell Noss, CMA acting as a scribe in the presence of Dr. Hervey Ard, MD.   The documentation recorded by the scribe accurately reflects the service I personally performed and the decisions made by me.   Robert Bellow, MD FACS  Electronically signed by Mayer Masker, MD at 06/17/2022 2:30 PM EDT

## 2022-06-21 ENCOUNTER — Other Ambulatory Visit: Payer: Self-pay | Admitting: General Surgery

## 2022-06-21 DIAGNOSIS — C50312 Malignant neoplasm of lower-inner quadrant of left female breast: Secondary | ICD-10-CM

## 2022-06-28 ENCOUNTER — Encounter
Admission: RE | Admit: 2022-06-28 | Discharge: 2022-06-28 | Disposition: A | Discharge status: Home / Self Care | Payer: BC Managed Care – PPO | Source: Ambulatory Visit | Attending: General Surgery | Admitting: General Surgery

## 2022-06-28 NOTE — Pre-Procedure Instructions (Signed)
Patient recently in the OR. Updates to medical and surgical history along with medication only. Reviewed instructions. Patient verbalized instructions.

## 2022-06-28 NOTE — Patient Instructions (Addendum)
Your procedure is scheduled on: 07/05/22 Report to [facility] at scheduled time .   Remember: Instructions that are not followed completely may result in serious medical risk, up to and including death, or upon the discretion of your surgeon and anesthesiologist your surgery may need to be rescheduled.      _X__ 1. Do not eat food after midnight the night before your procedure.                 No gum chewing or hard candies. You may drink clear liquids up to 2 hours                 before you are scheduled to arrive for your surgery- DO not drink clear                 liquids within 2 hours of the start of your surgery.                 Clear Liquids include:  water, apple juice without pulp, clear carbohydrate                 drink such as Clearfast or Gatorade, Black Coffee or Tea (Do not add                 anything to coffee or tea). Diabetics water only   __X__2.  On the morning of surgery brush your teeth with toothpaste and water, you                 may rinse your mouth with mouthwash if you wish.  Do not swallow any              toothpaste of mouthwash.                           _X__ 3.  No Alcohol for 24 hours before or after surgery.    _X__ 4.  Do Not Smoke or use e-cigarettes For 24 Hours Prior to Your Surgery.                 Do not use any chewable tobacco products for at least 6 hours prior to                 surgery.   ____  5.  Bring all medications with you on the day of surgery if instructed.    __X__  6.  Notify your doctor if there is any change in your medical condition                 (cold, fever, infections).                          Do not wear jewelry, make-up, hairpins, clips or nail polish. Do not wear lotions, powders, or perfumes. No deodorant Do not shave body hair 48 hours prior to surgery. Men may shave face and neck. Do not bring valuables to the hospital.     [facility] is not responsible for any belongings or valuables.   Contacts,  dentures/partials or body piercings may not be worn into surgery. Bring a case for your contacts, glasses or hearing aids, a denture cup will be supplied. Leave your suitcase in the car. After surgery it may be brought to your room. For patients admitted to the hospital, discharge time is determined by your treatment team.  Patients discharged the day of surgery will not be allowed to drive home.              Please read over the following fact sheets that you were given:              MRSA Information, CHG soap   __X__ Take these medicines the morning of surgery with A SIP OF WATER:                1. Probiotic Product (ALIGN) 4 MG CAPS             2.              3.              4.             5.             6.   ____ Fleet Enema (as directed)            __X__ Use CHG Soap/SAGE wipes as directed   ____ Use inhalers on the day of surgery   ____ Stop metformin/Janumet/Farxiga 2 days prior to surgery                       ____ Take 1/2 of usual insulin dose the night before surgery. No insulin the morning          of surgery.    ____ Stop Blood Thinners Coumadin/Plavix/Xarelto/Pleta/Pradaxa/Eliquis/Effient/Aspirin            on   Or contact your Surgeon, Cardiologist or Medical Doctor regarding         ability to stop your blood thinners   __X__ Stop Anti-inflammatories 7 days before surgery such as Advil, Ibuprofen, Motrin,   BC or Goodies Powder, Naprosyn, Naproxen, Aleve, Aspirin              May have Tylenol if needed   __X__ Stop all herbals and supplements, fish oil or vitamins  until after surgery.       ____ Bring C-Pap to the hospital.

## 2022-07-05 ENCOUNTER — Encounter
Admission: RE | Disposition: A | Discharge status: Home / Self Care | Payer: Self-pay | Source: Home / Self Care | Attending: General Surgery

## 2022-07-05 ENCOUNTER — Other Ambulatory Visit: Payer: Self-pay

## 2022-07-05 ENCOUNTER — Encounter
Admission: RE | Admit: 2022-07-05 | Discharge: 2022-07-05 | Disposition: A | Discharge status: Home / Self Care | Payer: BC Managed Care – PPO | Source: Ambulatory Visit | Attending: General Surgery | Admitting: General Surgery

## 2022-07-05 ENCOUNTER — Ambulatory Visit: Payer: BC Managed Care – PPO | Admitting: Certified Registered"

## 2022-07-05 ENCOUNTER — Ambulatory Visit
Admission: RE | Admit: 2022-07-05 | Discharge: 2022-07-05 | Disposition: A | Discharge status: Home / Self Care | Payer: BC Managed Care – PPO | Source: Ambulatory Visit | Attending: General Surgery | Admitting: General Surgery

## 2022-07-05 ENCOUNTER — Encounter: Payer: Self-pay | Admitting: General Surgery

## 2022-07-05 ENCOUNTER — Other Ambulatory Visit: Payer: Self-pay | Admitting: General Surgery

## 2022-07-05 ENCOUNTER — Ambulatory Visit
Admission: RE | Admit: 2022-07-05 | Discharge: 2022-07-05 | Disposition: A | Discharge status: Home / Self Care | Payer: BC Managed Care – PPO | Attending: General Surgery | Admitting: General Surgery

## 2022-07-05 DIAGNOSIS — Z79899 Other long term (current) drug therapy: Secondary | ICD-10-CM | POA: Diagnosis not present

## 2022-07-05 DIAGNOSIS — C50312 Malignant neoplasm of lower-inner quadrant of left female breast: Secondary | ICD-10-CM

## 2022-07-05 DIAGNOSIS — I10 Essential (primary) hypertension: Secondary | ICD-10-CM | POA: Diagnosis not present

## 2022-07-05 DIAGNOSIS — K219 Gastro-esophageal reflux disease without esophagitis: Secondary | ICD-10-CM | POA: Diagnosis not present

## 2022-07-05 HISTORY — PX: BREAST LUMPECTOMY WITH NEEDLE LOCALIZATION: SHX5759

## 2022-07-05 HISTORY — PX: OTHER SURGICAL HISTORY: SHX169

## 2022-07-05 HISTORY — PX: BREAST LUMPECTOMY: SHX2

## 2022-07-05 HISTORY — PX: AXILLARY SENTINEL NODE BIOPSY: SHX5738

## 2022-07-05 SURGERY — BREAST LUMPECTOMY WITH NEEDLE LOCALIZATION
Anesthesia: General | Site: Breast | Laterality: Left

## 2022-07-05 MED ORDER — ORAL CARE MOUTH RINSE: 15.0000 mL | Freq: Once | OROMUCOSAL | Status: AC

## 2022-07-05 MED ORDER — ONDANSETRON HCL 4 MG/2ML IJ SOLN
INTRAMUSCULAR | Status: DC | PRN
  Administered 2022-07-05: 4 mg via INTRAVENOUS

## 2022-07-05 MED ORDER — METHYLENE BLUE 1 % INJ SOLN
INTRAVENOUS | Status: DC | PRN
  Administered 2022-07-05: 5 mL via INTRADERMAL

## 2022-07-05 MED ORDER — ACETAMINOPHEN 10 MG/ML IV SOLN
INTRAVENOUS | Status: DC | PRN
  Administered 2022-07-05: 1000 mg via INTRAVENOUS

## 2022-07-05 MED ORDER — STERILE WATER FOR IRRIGATION IR SOLN
Status: DC | PRN
  Administered 2022-07-05: 500 mL

## 2022-07-05 MED ORDER — FENTANYL CITRATE (PF) 100 MCG/2ML IJ SOLN
INTRAMUSCULAR | Status: AC
  Filled 2022-07-05: qty 2

## 2022-07-05 MED ORDER — PROPOFOL 10 MG/ML IV BOLUS
INTRAVENOUS | Status: AC
  Filled 2022-07-05: qty 20

## 2022-07-05 MED ORDER — CEFAZOLIN SODIUM-DEXTROSE 2-4 GM/100ML-% IV SOLN
2.0000 g | INTRAVENOUS | Status: AC
  Administered 2022-07-05: 2 g via INTRAVENOUS

## 2022-07-05 MED ORDER — GLYCOPYRROLATE 0.2 MG/ML IJ SOLN
INTRAMUSCULAR | Status: AC
  Filled 2022-07-05: qty 1

## 2022-07-05 MED ORDER — EPHEDRINE 5 MG/ML INJ
INTRAVENOUS | Status: AC
  Filled 2022-07-05: qty 5

## 2022-07-05 MED ORDER — CHLORHEXIDINE GLUCONATE CLOTH 2 % EX PADS
6.0000 | MEDICATED_PAD | Freq: Once | CUTANEOUS | Status: AC
  Administered 2022-07-05: 6 via TOPICAL

## 2022-07-05 MED ORDER — ONDANSETRON HCL 4 MG/2ML IJ SOLN
INTRAMUSCULAR | Status: AC
  Filled 2022-07-05: qty 2

## 2022-07-05 MED ORDER — BUPIVACAINE-EPINEPHRINE (PF) 0.5% -1:200000 IJ SOLN
INTRAMUSCULAR | Status: DC | PRN
  Administered 2022-07-05: 30 mL

## 2022-07-05 MED ORDER — LIDOCAINE HCL (CARDIAC) PF 100 MG/5ML IV SOSY
PREFILLED_SYRINGE | INTRAVENOUS | Status: DC | PRN
  Administered 2022-07-05: 100 mg via INTRAVENOUS

## 2022-07-05 MED ORDER — FAMOTIDINE 20 MG PO TABS: 20.0000 mg | ORAL_TABLET | Freq: Once | ORAL | Status: AC

## 2022-07-05 MED ORDER — PHENYLEPHRINE 80 MCG/ML (10ML) SYRINGE FOR IV PUSH (FOR BLOOD PRESSURE SUPPORT)
PREFILLED_SYRINGE | INTRAVENOUS | Status: AC
  Filled 2022-07-05: qty 10

## 2022-07-05 MED ORDER — CHLORHEXIDINE GLUCONATE 0.12 % MT SOLN: 15.0000 mL | Freq: Once | OROMUCOSAL | Status: AC

## 2022-07-05 MED ORDER — HYDROCODONE-ACETAMINOPHEN 5-325 MG PO TABS: 1.0000 | ORAL_TABLET | ORAL | 0 refills | Status: DC | PRN

## 2022-07-05 MED ORDER — KETOROLAC TROMETHAMINE 30 MG/ML IJ SOLN
INTRAMUSCULAR | Status: AC
  Filled 2022-07-05: qty 1

## 2022-07-05 MED ORDER — FENTANYL CITRATE (PF) 100 MCG/2ML IJ SOLN: 25.0000 ug | INTRAMUSCULAR | Status: DC | PRN

## 2022-07-05 MED ORDER — LIDOCAINE HCL (PF) 2 % IJ SOLN
INTRAMUSCULAR | Status: AC
  Filled 2022-07-05: qty 5

## 2022-07-05 MED ORDER — CEFAZOLIN SODIUM-DEXTROSE 2-4 GM/100ML-% IV SOLN
INTRAVENOUS | Status: AC
  Filled 2022-07-05: qty 100

## 2022-07-05 MED ORDER — PROPOFOL 10 MG/ML IV BOLUS
INTRAVENOUS | Status: DC | PRN
  Administered 2022-07-05: 150 mg via INTRAVENOUS

## 2022-07-05 MED ORDER — CHLORHEXIDINE GLUCONATE 0.12 % MT SOLN
OROMUCOSAL | Status: AC
  Administered 2022-07-05: 15 mL via OROMUCOSAL
  Filled 2022-07-05: qty 15

## 2022-07-05 MED ORDER — ACETAMINOPHEN 10 MG/ML IV SOLN
INTRAVENOUS | Status: AC
  Filled 2022-07-05: qty 100

## 2022-07-05 MED ORDER — GLYCOPYRROLATE 0.2 MG/ML IJ SOLN
INTRAMUSCULAR | Status: DC | PRN
  Administered 2022-07-05: .2 mg via INTRAVENOUS

## 2022-07-05 MED ORDER — BUPIVACAINE-EPINEPHRINE (PF) 0.5% -1:200000 IJ SOLN
INTRAMUSCULAR | Status: AC
  Filled 2022-07-05: qty 30

## 2022-07-05 MED ORDER — KETOROLAC TROMETHAMINE 30 MG/ML IJ SOLN
INTRAMUSCULAR | Status: DC | PRN
  Administered 2022-07-05: 30 mg via INTRAVENOUS

## 2022-07-05 MED ORDER — LACTATED RINGERS IV SOLN: INTRAVENOUS | Status: DC

## 2022-07-05 MED ORDER — PHENYLEPHRINE HCL (PRESSORS) 10 MG/ML IV SOLN
INTRAVENOUS | Status: DC | PRN
  Administered 2022-07-05 (×3): 160 ug via INTRAVENOUS

## 2022-07-05 MED ORDER — EPHEDRINE SULFATE (PRESSORS) 50 MG/ML IJ SOLN
INTRAMUSCULAR | Status: DC | PRN
  Administered 2022-07-05 (×2): 10 mg via INTRAVENOUS
  Administered 2022-07-05: 5 mg via INTRAVENOUS

## 2022-07-05 MED ORDER — CHLORHEXIDINE GLUCONATE CLOTH 2 % EX PADS: 6.0000 | MEDICATED_PAD | Freq: Once | CUTANEOUS | Status: DC

## 2022-07-05 MED ORDER — ONDANSETRON HCL 4 MG/2ML IJ SOLN: 4.0000 mg | Freq: Once | INTRAMUSCULAR | Status: DC | PRN

## 2022-07-05 MED ORDER — DEXAMETHASONE SODIUM PHOSPHATE 10 MG/ML IJ SOLN
INTRAMUSCULAR | Status: AC
  Filled 2022-07-05: qty 1

## 2022-07-05 MED ORDER — METHYLENE BLUE 1 % INJ SOLN
INTRAVENOUS | Status: AC
  Filled 2022-07-05: qty 10

## 2022-07-05 MED ORDER — FAMOTIDINE 20 MG PO TABS
ORAL_TABLET | ORAL | Status: AC
  Administered 2022-07-05: 20 mg via ORAL
  Filled 2022-07-05: qty 1

## 2022-07-05 MED ORDER — MIDAZOLAM HCL 2 MG/2ML IJ SOLN
INTRAMUSCULAR | Status: DC | PRN
  Administered 2022-07-05: 2 mg via INTRAVENOUS

## 2022-07-05 MED ORDER — FENTANYL CITRATE (PF) 100 MCG/2ML IJ SOLN
INTRAMUSCULAR | Status: DC | PRN
  Administered 2022-07-05 (×2): 25 ug via INTRAVENOUS
  Administered 2022-07-05: 50 ug via INTRAVENOUS
  Administered 2022-07-05 (×2): 25 ug via INTRAVENOUS
  Administered 2022-07-05: 50 ug via INTRAVENOUS

## 2022-07-05 MED ORDER — MIDAZOLAM HCL 2 MG/2ML IJ SOLN
INTRAMUSCULAR | Status: AC
  Filled 2022-07-05: qty 2

## 2022-07-05 MED ORDER — TECHNETIUM TC 99M TILMANOCEPT KIT
0.9900 | PACK | Freq: Once | INTRAVENOUS | Status: AC | PRN
  Administered 2022-07-05: 0.99 via INTRADERMAL

## 2022-07-05 MED ORDER — DEXMEDETOMIDINE HCL IN NACL 80 MCG/20ML IV SOLN
INTRAVENOUS | Status: DC | PRN
  Administered 2022-07-05 (×5): 4 ug via BUCCAL
  Administered 2022-07-05: 8 ug via BUCCAL

## 2022-07-05 MED ORDER — SEVOFLURANE IN SOLN
RESPIRATORY_TRACT | Status: AC
  Filled 2022-07-05: qty 250

## 2022-07-05 MED ORDER — DEXAMETHASONE SODIUM PHOSPHATE 10 MG/ML IJ SOLN
INTRAMUSCULAR | Status: DC | PRN
  Administered 2022-07-05: 10 mg via INTRAVENOUS

## 2022-07-05 SURGICAL SUPPLY — 59 items
APL PRP STRL LF DISP 70% ISPRP (MISCELLANEOUS) ×1
BLADE BOVIE TIP EXT 4 (BLADE) IMPLANT
BLADE SURG 15 STRL SS SAFETY (BLADE) ×2 IMPLANT
BULB RESERV EVAC DRAIN JP 100C (MISCELLANEOUS) IMPLANT
CHLORAPREP W/TINT 26 (MISCELLANEOUS) ×1 IMPLANT
CNTNR SPEC 2.5X3XGRAD LEK (MISCELLANEOUS)
CONT SPEC 4OZ STER OR WHT (MISCELLANEOUS)
CONT SPEC 4OZ STRL OR WHT (MISCELLANEOUS)
CONTAINER SPEC 2.5X3XGRAD LEK (MISCELLANEOUS) IMPLANT
COVER PROBE FLX POLY STRL (MISCELLANEOUS) ×1 IMPLANT
COVER PROBE GAMMA FINDER SLV (MISCELLANEOUS) IMPLANT
DEVICE DUBIN SPECIMEN MAMMOGRA (MISCELLANEOUS) ×1 IMPLANT
DRAIN CHANNEL JP 15F RND 16 (MISCELLANEOUS) IMPLANT
DRAPE LAPAROTOMY TRNSV 106X77 (MISCELLANEOUS) ×1 IMPLANT
DRSG GAUZE FLUFF 36X18 (GAUZE/BANDAGES/DRESSINGS) ×2 IMPLANT
DRSG TELFA 3X8 NADH STRL (GAUZE/BANDAGES/DRESSINGS) ×1 IMPLANT
ELECT CAUTERY BLADE TIP 2.5 (TIP) ×1
ELECT REM PT RETURN 9FT ADLT (ELECTROSURGICAL) ×1
ELECTRODE CAUTERY BLDE TIP 2.5 (TIP) ×1 IMPLANT
ELECTRODE REM PT RTRN 9FT ADLT (ELECTROSURGICAL) ×1 IMPLANT
GAUZE 4X4 16PLY ~~LOC~~+RFID DBL (SPONGE) ×1 IMPLANT
GLOVE BIO SURGEON STRL SZ7.5 (GLOVE) ×1 IMPLANT
GLOVE SURG UNDER LTX SZ8 (GLOVE) ×1 IMPLANT
GOWN STRL REUS W/ TWL LRG LVL3 (GOWN DISPOSABLE) ×2 IMPLANT
GOWN STRL REUS W/TWL LRG LVL3 (GOWN DISPOSABLE) ×2
KIT TURNOVER KIT A (KITS) ×1 IMPLANT
LABEL OR SOLS (LABEL) ×1 IMPLANT
MANIFOLD NEPTUNE II (INSTRUMENTS) ×1 IMPLANT
MARGIN MAP 10MM (MISCELLANEOUS) ×1 IMPLANT
NDL HYPO 25X1 1.5 SAFETY (NEEDLE) ×2 IMPLANT
NDL SPNL 20GX3.5 QUINCKE YW (NEEDLE) IMPLANT
NEEDLE HYPO 22GX1.5 SAFETY (NEEDLE) ×1 IMPLANT
NEEDLE HYPO 25X1 1.5 SAFETY (NEEDLE) ×2 IMPLANT
NEEDLE SPNL 20GX3.5 QUINCKE YW (NEEDLE) IMPLANT
PACK BASIN MINOR ARMC (MISCELLANEOUS) ×1 IMPLANT
RETRACTOR RING XSMALL (MISCELLANEOUS) IMPLANT
RTRCTR WOUND ALEXIS 13CM XS SH (MISCELLANEOUS) ×1
SHEARS FOC LG CVD HARMONIC 17C (MISCELLANEOUS) IMPLANT
SHEARS HARMONIC 9CM CVD (BLADE) IMPLANT
STRIP CLOSURE SKIN 1/2X4 (GAUZE/BANDAGES/DRESSINGS) ×1 IMPLANT
SUT ETHILON 3-0 FS-10 30 BLK (SUTURE) ×1
SUT SILK 2 0 (SUTURE) ×1
SUT SILK 2-0 18XBRD TIE 12 (SUTURE) ×1 IMPLANT
SUT VIC AB 2-0 CT1 27 (SUTURE) ×4
SUT VIC AB 2-0 CT1 TAPERPNT 27 (SUTURE) ×2 IMPLANT
SUT VIC AB 3-0 54X BRD REEL (SUTURE) ×1 IMPLANT
SUT VIC AB 3-0 BRD 54 (SUTURE) ×1
SUT VIC AB 3-0 SH 27 (SUTURE) ×2
SUT VIC AB 3-0 SH 27X BRD (SUTURE) ×2 IMPLANT
SUT VIC AB 4-0 FS2 27 (SUTURE) ×2 IMPLANT
SUTURE EHLN 3-0 FS-10 30 BLK (SUTURE) ×1 IMPLANT
SWABSTK COMLB BENZOIN TINCTURE (MISCELLANEOUS) ×1 IMPLANT
SYR 10ML LL (SYRINGE) ×1 IMPLANT
SYR BULB IRRIG 60ML STRL (SYRINGE) ×1 IMPLANT
TAPE TRANSPORE STRL 2 31045 (GAUZE/BANDAGES/DRESSINGS) ×1 IMPLANT
TRAP FLUID SMOKE EVACUATOR (MISCELLANEOUS) ×1 IMPLANT
TRAP NEPTUNE SPECIMEN COLLECT (MISCELLANEOUS) ×1 IMPLANT
WATER STERILE IRR 1000ML POUR (IV SOLUTION) ×1 IMPLANT
WATER STERILE IRR 500ML POUR (IV SOLUTION) ×1 IMPLANT

## 2022-07-05 NOTE — Anesthesia Procedure Notes (Signed)
Procedure Name: LMA Insertion Date/Time: 07/05/2022 1:10 PM  Performed by: Jonna Clark, CRNAPre-anesthesia Checklist: Patient identified, Patient being monitored, Timeout performed, Emergency Drugs available and Suction available Patient Re-evaluated:Patient Re-evaluated prior to induction Oxygen Delivery Method: Circle system utilized Preoxygenation: Pre-oxygenation with 100% oxygen Induction Type: IV induction Ventilation: Mask ventilation without difficulty LMA: LMA inserted LMA Size: 4.0 Tube type: Oral Number of attempts: 1 Placement Confirmation: positive ETCO2 and breath sounds checked- equal and bilateral Tube secured with: Tape Dental Injury: Teeth and Oropharynx as per pre-operative assessment

## 2022-07-05 NOTE — OR Nursing (Signed)
Dr. Bary Castilla in to see pt in postop @ 1630.

## 2022-07-05 NOTE — Anesthesia Preprocedure Evaluation (Signed)
Anesthesia Evaluation  Patient identified by MRN, date of birth, ID band Patient awake    Reviewed: Allergy & Precautions, H&P , NPO status , Patient's Chart, lab work & pertinent test results, reviewed documented beta blocker date and time   History of Anesthesia Complications Negative for: history of anesthetic complications  Airway Mallampati: II  TM Distance: >3 FB Neck ROM: full    Dental  (+) Teeth Intact, Dental Advidsory Given   Pulmonary neg pulmonary ROS,    Pulmonary exam normal        Cardiovascular Exercise Tolerance: Good hypertension, On Medications (-) angina(-) Past MI and (-) Cardiac Stents Normal cardiovascular exam(-) dysrhythmias (-) Valvular Problems/Murmurs Rate:Normal     Neuro/Psych negative neurological ROS  negative psych ROS   GI/Hepatic Neg liver ROS, hiatal hernia, PUD, GERD  Medicated,  Endo/Other  negative endocrine ROS  Renal/GU negative Renal ROS  negative genitourinary   Musculoskeletal   Abdominal   Peds  Hematology negative hematology ROS (+)   Anesthesia Other Findings   Reproductive/Obstetrics negative OB ROS                             Anesthesia Physical  Anesthesia Plan  ASA: 2  Anesthesia Plan: General   Post-op Pain Management:    Induction: Intravenous  PONV Risk Score and Plan: Ondansetron, Dexamethasone, Treatment may vary due to age or medical condition and Midazolam  Airway Management Planned: LMA  Additional Equipment:   Intra-op Plan:   Post-operative Plan: Extubation in OR  Informed Consent: I have reviewed the patients History and Physical, chart, labs and discussed the procedure including the risks, benefits and alternatives for the proposed anesthesia with the patient or authorized representative who has indicated his/her understanding and acceptance.       Plan Discussed with: CRNA  Anesthesia Plan Comments:          Anesthesia Quick Evaluation

## 2022-07-05 NOTE — Discharge Instructions (Addendum)

## 2022-07-05 NOTE — Op Note (Signed)
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  Postoperative diagnosis: Same.  Operative procedure: Wire localization and wide excision, ultrasound guidance, sentinel node biopsy, tissue transfer greater than 10 cm.  Operating surgeon: Hervey Ard, MD.  Anesthesia: General by LMA, Marcaine 0.5% with 1: 200,000 units of epinephrine, 30 cc local infiltration and pectoral block.  Estimated blood loss: 30 cc.  Clinical note: This 61 year old woman was noted to have 2 foci of abnormality in the lower inner quadrant.  MRI was completed the raised question about 2 additional areas, one showing ADH in the upper outer quadrant.  She is undergone wide excision of this area with no evidence of malignancy.  She desired breast conservation.  She is a candidate for wide excision of the  Lower inner quadrant lesion.  She underwent injection with technetium sulfur colloid prior to the procedure.  Wire localization was completed prior to presentation to the operating theater.  Patient received Ancef on induction of anesthesia.  SCD stockings for DVT prevention.  Operative note: The periareolar skin was cleansed with alcohol and 5 cc of 0.5% methylene blue was injected in the subareolar plexus.  The breast chest and axilla was then cleansed with ChloraPrep and draped.  Ultrasound was used to help identify the distal tip of the deep wire and for instillation of the local anesthetic for pectoral block.  Field block anesthesia was placed along the planned wide excision site in the lower inner quadrant.  A radial incision was made from the edge of the areola towards the inframammary fold.  The skin was incised sharply remaining dissection completed electrocautery.  The adipose tissue was elevated off the underlying breast parenchyma.  Both localizing wires were brought into the wound.  An extra small Alexis wound protector was placed.  A 4 x 5 x 7 cm block of tissue was excised  orientated and specimen radiograph confirmed both the 2 known biopsy sites for cancer as well as a previous benign biopsy site.  While the pathology department was processing the specimen was attention was turned to the axilla.  The node seeker device was used to identify area of increased counts in the lower aspect of the axilla.  A transverse incision was made.  Skin was incised sharply remaining dissection electrocautery.  The expander was sent down to the axillary envelope.  3 lymph nodes were identified, the largest was blue and hot at about 400 count.  The additional 2 nodes were only hot at about 100.  No additional nodal tissue was appreciated.  The axillary envelope was closed with 2-0 Vicryl figure-of-eight sutures as was the adipose layer.  The skin was closed with a running 4-0 Vicryl subcuticular suture.  The pathologist reported that the anterior margin was very close.  As this was adipose tissue was elected to excise an ellipse of skin.  To do this the original incision was closed with a running 3-0 nylon suture.  A 3-1/2 cm wide 8 cm long strip of skin and subcutaneous fat was excised down to the breast parenchyma.  This was orientated and sent in formalin for routine histology.  The medial aspect was subsequently tailored to provide smooth closure.  The breast was elevated off the underlying pectoralis muscle circumferentially to allow closure of the greater than 10 cm dead space.  This was completed with interrupted 2-0 Vicryl figure-of-eight sutures in 3 layers.  The adipose layer was approximated with interrupted 2-0 Vicryl simple sutures.  At the medial and lateral  ends of the incision interrupted 4-0 Vicryl subcuticular sutures were used.  The midportion was closed with a running suture of the same material.  Benzoin, Steri-Strips, Telfa, fluff gauze and a compressive wrap were applied.  Patient tolerated procedure well was taken the PACU in stable condition.

## 2022-07-05 NOTE — Transfer of Care (Signed)
Immediate Anesthesia Transfer of Care Note  Patient: [name]  Procedure(s) Performed: BREAST LUMPECTOMY WITH NEEDLE LOCALIZATION (Left: Breast) AXILLARY SENTINEL NODE BIOPSY (Left: Breast)  Patient Location: PACU  Anesthesia Type:General  Level of Consciousness: drowsy and patient cooperative  Airway & Oxygen Therapy: Patient Spontanous Breathing and Patient connected to face mask oxygen  Post-op Assessment: Report given to RN and Post -op Vital signs reviewed and stable  Post vital signs: Reviewed and stable  Last Vitals:  Vitals Value Taken Time  BP 101/85 07/05/22 1549  Temp    Pulse 102 07/05/22 1549  Resp 24 07/05/22 1549  SpO2 96 % 07/05/22 1549  Vitals shown include unvalidated device data.  Last Pain:  Vitals:   07/05/22 0945  TempSrc: Temporal  PainSc: 0-No pain         Complications: No notable events documented.

## 2022-07-05 NOTE — H&P (Signed)
[name] [id number] Aug 09, 1961     HPI:  Patient with multifocal invasive mammary carcinoma in the lower inner quadrant of the left breast . For wide excision.   Medications Prior to Admission  Medication Sig Dispense Refill Last Dose   atorvastatin (LIPITOR) 10 MG tablet Take 10 mg by mouth every evening.   Past Week   bisoprolol-hydrochlorothiazide (ZIAC) 5-6.25 MG tablet Take 1 tablet by mouth every morning.   07/04/2022   mesalamine (LIALDA) 1.2 g EC tablet TAKE 1 TABLET (1.2 G TOTAL) BY MOUTH DAILY WITH BREAKFAST FOR 120 DAYS TAKE ONE TABLET DAILY AS PRESCRIBED.   07/04/2022   Probiotic Product (ALIGN) 4 MG CAPS Take 1 capsule by mouth daily.   07/05/2022   HYDROcodone-acetaminophen (NORCO/VICODIN) 5-325 MG tablet Take 1 tablet by mouth every 4 (four) hours as needed for moderate pain. (Patient not taking: Reported on 06/28/2022) 12 tablet 0    Allergies  Allergen Reactions   Amoxicillin Diarrhea   Past Medical History:  Diagnosis Date   Breast cancer ([place])    left   GERD (gastroesophageal reflux disease)    History of hiatal hernia    HLD (hyperlipidemia)    Hypertension    Ulcerative colitis ([place])    Past Surgical History:  Procedure Laterality Date   ABDOMINAL HYSTERECTOMY     BREAST BIOPSY Left 04/21/2022   [place] bx, path pending, ribbon marker   BREAST BIOPSY Left 04/21/2022   [place] bx, path pending, coil marker   BREAST BIOPSY Left 06/09/2022   Procedure: BREAST BIOPSY WITH NEEDLE LOCALIZATION;  Surgeon: Robert Bellow, MD;  Location: [facility] ORS;  Service: General;  Laterality: Left;   breast wire placement Left 07/05/2022   EYE MUSCLE SURGERY Left 09/14/1967   FRACTURE SURGERY Left 09/13/1972   arm   MANDIBLE SURGERY Bilateral 09/13/1977   UPPER GI ENDOSCOPY  09/13/2012   stretching of esophagus   WISDOM TOOTH EXTRACTION  09/14/1991   3 wisdom teeth   Social History   Socioeconomic History   Marital status: Single    Spouse name: Not on file   Number  of children: Not on file   Years of education: Not on file   Highest education level: Not on file  Occupational History   Not on file  Tobacco Use   Smoking status: Never   Smokeless tobacco: Not on file  Vaping Use   Vaping Use: Never used  Substance and Sexual Activity   Alcohol use: No   Drug use: No   Sexual activity: Not on file  Other Topics Concern   Not on file  Social History Narrative   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      ROS: Negative.     PE: HEENT: Negative. Lungs: Clear. Cardio: RR.   Assessment/Plan:  Proceed with planned wide excision and SLN exam.    [facility] 07/05/2022

## 2022-07-06 ENCOUNTER — Encounter: Payer: Self-pay | Admitting: General Surgery

## 2022-07-08 LAB — SURGICAL PATHOLOGY

## 2022-07-19 ENCOUNTER — Ambulatory Visit
Admission: RE | Admit: 2022-07-19 | Discharge: 2022-07-19 | Disposition: A | Discharge status: Home / Self Care | Payer: BC Managed Care – PPO | Source: Ambulatory Visit | Attending: Radiation Oncology | Admitting: Radiation Oncology

## 2022-07-19 ENCOUNTER — Encounter: Payer: Self-pay | Admitting: *Deleted

## 2022-07-19 ENCOUNTER — Encounter: Payer: Self-pay | Admitting: Radiation Oncology

## 2022-07-19 VITALS — BP 137/84 | HR 71 | Temp 99.0°F | Resp 16 | Ht 63.5 in | Wt 179.6 lb

## 2022-07-19 DIAGNOSIS — C50312 Malignant neoplasm of lower-inner quadrant of left female breast: Secondary | ICD-10-CM | POA: Insufficient documentation

## 2022-07-19 DIAGNOSIS — Z510 Encounter for antineoplastic radiation therapy: Secondary | ICD-10-CM | POA: Insufficient documentation

## 2022-07-19 DIAGNOSIS — Z170 Estrogen receptor positive status [ER+]: Secondary | ICD-10-CM | POA: Insufficient documentation

## 2022-07-19 NOTE — Progress Notes (Signed)
Dr. Baruch Gouty requested oncotype Dx be sent.   Reviewed with Dr. Grayland Ormond since Dr. B is currently unavailable and he said ok to order oncotype under Dr. Jacinto Reap  Oncotype order submitted online on surgical path [phone], Order ID  [id number].

## 2022-07-19 NOTE — Consult Note (Signed)
NEW PATIENT EVALUATION  Name: [name]  MRN: [id number]  Date:   07/19/2022     DOB: Dec 27, 1960   This 61 y.o. 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  REFERRING PHYSICIAN: Wayland Denis, PA-C  CHIEF COMPLAINT:  Chief Complaint  Patient presents with   Breast Cancer    DIAGNOSIS: The encounter diagnosis was [name], estrogen receptor positive ([place]).   PREVIOUS INVESTIGATIONS:  Mammogram and ultrasound and MRI scans reviewed Pathology reports reviewed Clinical notes reviewed  HPI: Patient is a 61 year old female who presented with an abnormal mammogram of her left breast.  There was a spiculated mass identified in the lower inner quadrant.  Targeted ultrasound at the 8 o'clock position 6 cm from nipple showed 1.9 x 1.0 x 1.3 cm area.  No axillary adenopathy by ultrasound was noted.  She underwent core biopsy which was positive for invasive mammary carcinoma.  Tumor was ER/PR positive HER2/neu not overexpressed.  There is also a lesion at the 9 o'clock position again positive for invasive mammary carcinoma.  Patient also had an MRI scan with MRI guided biopsy of the non-mass enhancement in the lower inner quadrant of left breast as well as a biopsy of an enhancing mass in the central left breast.  There is lesion showed atypical lobular hyperplasia complex sclerosing lesion.  Patient opted for breast conservation and underwent wide local excision of both positive lesions showing 1.8 cm invasive mammary carcinoma with multiple foci invasive carcinoma with a number of foci at 2.  One was 1.8 cm the other was 0.75 cm.  Margins were clear except for DCIS at the lateral margin.  3 sentinel lymph nodes were examined all negative for malignancy.  She has done well postoperatively.  Does have some  tenderness in the left axilla otherwise is without complaint.  Now referred to radiation oncology for opinion.  PLANNED TREATMENT REGIMEN: Hypofractionated left whole breast radiation plus boost  PAST MEDICAL HISTORY:  has a past medical history of Breast cancer ([place]), GERD (gastroesophageal reflux disease), History of hiatal hernia, HLD (hyperlipidemia), Hypertension, and Ulcerative colitis ([place]).    PAST SURGICAL HISTORY:  Past Surgical History:  Procedure Laterality Date   ABDOMINAL HYSTERECTOMY     AXILLARY SENTINEL NODE BIOPSY Left 07/05/2022   Procedure: AXILLARY SENTINEL NODE BIOPSY;  Surgeon: Robert Bellow, MD;  Location: [facility] ORS;  Service: General;  Laterality: Left;   BREAST BIOPSY Left 04/21/2022   [place] bx, path pending, ribbon marker   BREAST BIOPSY Left 04/21/2022   [place] bx, path pending, coil marker   BREAST BIOPSY Left 06/09/2022   Procedure: BREAST BIOPSY WITH NEEDLE LOCALIZATION;  Surgeon: Robert Bellow, MD;  Location: [facility] ORS;  Service: General;  Laterality: Left;   BREAST LUMPECTOMY WITH NEEDLE LOCALIZATION Left 07/05/2022   Procedure: BREAST LUMPECTOMY WITH NEEDLE LOCALIZATION;  Surgeon: Robert Bellow, MD;  Location: [facility] ORS;  Service: General;  Laterality: Left;   breast wire placement Left 07/05/2022   EYE MUSCLE SURGERY Left 09/14/1967   FRACTURE SURGERY Left 09/13/1972   arm   MANDIBLE SURGERY Bilateral 09/13/1977   UPPER GI ENDOSCOPY  09/13/2012   stretching of esophagus   WISDOM TOOTH EXTRACTION  09/14/1991   3 wisdom teeth    FAMILY HISTORY: family history includes Breast cancer in her sister; Breast cancer (  age of onset: 32) in her cousin; Diabetes in her mother; Heart attack in her brother; Prostate cancer in her father.  SOCIAL HISTORY:  reports that she has never smoked. She does not have any smokeless tobacco history on file. She reports that she does not drink alcohol and does not use drugs.  ALLERGIES: Amoxicillin  MEDICATIONS:   Current Outpatient Medications  Medication Sig Dispense Refill   atorvastatin (LIPITOR) 10 MG tablet Take 10 mg by mouth every evening.     bisoprolol-hydrochlorothiazide (ZIAC) 5-6.25 MG tablet Take 1 tablet by mouth every morning.     mesalamine (LIALDA) 1.2 g EC tablet TAKE 1 TABLET (1.2 G TOTAL) BY MOUTH DAILY WITH BREAKFAST FOR 120 DAYS TAKE ONE TABLET DAILY AS PRESCRIBED.     Probiotic Product (ALIGN) 4 MG CAPS Take 1 capsule by mouth daily.     No current facility-administered medications for this encounter.    ECOG PERFORMANCE STATUS:  0 - Asymptomatic  REVIEW OF SYSTEMS: Patient denies any weight loss, fatigue, weakness, fever, chills or night sweats. Patient denies any loss of vision, blurred vision. Patient denies any ringing  of the ears or hearing loss. No irregular heartbeat. Patient denies heart murmur or history of fainting. Patient denies any chest pain or pain radiating to her upper extremities. Patient denies any shortness of breath, difficulty breathing at night, cough or hemoptysis. Patient denies any swelling in the lower legs. Patient denies any nausea vomiting, vomiting of blood, or coffee ground material in the vomitus. Patient denies any stomach pain. Patient states has had normal bowel movements no significant constipation or diarrhea. Patient denies any dysuria, hematuria or significant nocturia. Patient denies any problems walking, swelling in the joints or loss of balance. Patient denies any skin changes, loss of hair or loss of weight. Patient denies any excessive worrying or anxiety or significant depression. Patient denies any problems with insomnia. Patient denies excessive thirst, polyuria, polydipsia. Patient denies any swollen glands, patient denies easy bruising or easy bleeding. Patient denies any recent infections, allergies or URI. Patient "s visual fields have not changed significantly in recent time.   PHYSICAL EXAM: BP 137/84 (BP Location: Right Arm,  Patient Position: Sitting, Cuff Size: Normal)   Pulse 71   Temp 99 F (37.2 C) (Tympanic)   Resp 16   Ht 5' 3.5" (1.613 m) Comment: tated HT  Wt 179 lb 9.6 oz (81.5 kg)   LMP 11/01/2015   BMI 31.32 kg/m  She is status post wide local excision of the left breast.  She had multiple incision sites all healing well.  No dominant masses noted in either breast.  No axillary or supraclavicular adenopathy is appreciated.  Well-developed well-nourished patient in NAD. HEENT reveals PERLA, EOMI, discs not visualized.  Oral cavity is clear. No oral mucosal lesions are identified. Neck is clear without evidence of cervical or supraclavicular adenopathy. Lungs are clear to A&P. Cardiac examination is essentially unremarkable with regular rate and rhythm without murmur rub or thrill. Abdomen is benign with no organomegaly or masses noted. Motor sensory and DTR levels are equal and symmetric in the upper and lower extremities. Cranial nerves II through XII are grossly intact. Proprioception is intact. No peripheral adenopathy or edema is identified. No motor or sensory levels are noted. Crude visual fields are within normal range.  LABORATORY DATA: Pathology reports reviewed    RADIOLOGY RESULTS: Mammograms ultrasound and MRI scans reviewed compatible with above-stated findings   IMPRESSION: Multifocal invasive mammary carcinoma the left  breast status post wide local excision and sentinel node biopsy for ER/PR positive disease in 61 year old female  PLAN: At this time of asked Dr. Laney Potash he is staff to perform Oncotype DX.  This will guide in decision about systemic therapy.  Should she not qualify for systemic treatment would go ahead with hypofractionated breath-hold left breast treatment over 3 weeks.  I also boost her scar another 1600 centigrade based on the positive DCIS margin.  Risks and benefits of treatment including skin reaction fatigue alteration of blood counts possible inclusion of superficial  lung all were reviewed in detail with the patient.  She also be a candidate for endocrine therapy after completion of radiation.  Patient comprehends my recommendations well.  I have scheduled for simulation the end of next week and will review hopefully her Oncotype DX prior to that.  I would like to take this opportunity to thank you for allowing me to participate in the care of your patient.Noreene Filbert, MD

## 2022-07-20 NOTE — Anesthesia Postprocedure Evaluation (Signed)
Anesthesia Post Note  Patient: AZULA ZAPPIA  Procedure(s) Performed: BREAST LUMPECTOMY WITH NEEDLE LOCALIZATION (Left: Breast) AXILLARY SENTINEL NODE BIOPSY (Left: Breast)  Patient location during evaluation: PACU Anesthesia Type: General Level of consciousness: awake and alert Pain management: pain level controlled Vital Signs Assessment: post-procedure vital signs reviewed and stable Respiratory status: spontaneous breathing, nonlabored ventilation, respiratory function stable and patient connected to nasal cannula oxygen Cardiovascular status: blood pressure returned to baseline and stable Postop Assessment: no apparent nausea or vomiting Anesthetic complications: no   No notable events documented.   Last Vitals:  Vitals:   07/05/22 1607 07/05/22 1615  BP: 110/75 111/73  Pulse: (!) 103 100  Resp: 18 18  Temp:  36.7 C  SpO2: 95% 95%    Last Pain:  Vitals:   07/06/22 0834  TempSrc:   PainSc: 2                  Martha Clan

## 2022-07-22 ENCOUNTER — Encounter: Payer: Self-pay | Admitting: *Deleted

## 2022-07-22 NOTE — Progress Notes (Signed)
Prior auth for oncotype dx testing faxed to [facility]

## 2022-07-28 ENCOUNTER — Encounter: Payer: Self-pay | Admitting: Internal Medicine

## 2022-07-29 ENCOUNTER — Encounter: Payer: Self-pay | Admitting: *Deleted

## 2022-07-29 ENCOUNTER — Ambulatory Visit
Admission: RE | Admit: 2022-07-29 | Discharge: 2022-07-29 | Disposition: A | Discharge status: Home / Self Care | Payer: BC Managed Care – PPO | Source: Ambulatory Visit | Attending: Radiation Oncology | Admitting: Radiation Oncology

## 2022-07-29 DIAGNOSIS — C50312 Malignant neoplasm of lower-inner quadrant of left female breast: Secondary | ICD-10-CM | POA: Diagnosis present

## 2022-07-29 DIAGNOSIS — Z510 Encounter for antineoplastic radiation therapy: Secondary | ICD-10-CM | POA: Diagnosis not present

## 2022-07-29 DIAGNOSIS — Z170 Estrogen receptor positive status [ER+]: Secondary | ICD-10-CM | POA: Diagnosis not present

## 2022-07-29 NOTE — Progress Notes (Signed)
Met with patient during CT sim.   Reviewed oncotype results with her.   She has an appt. To follow up with Dr. Jacinto Reap on 12/4.

## 2022-08-03 DIAGNOSIS — Z510 Encounter for antineoplastic radiation therapy: Secondary | ICD-10-CM | POA: Diagnosis not present

## 2022-08-10 ENCOUNTER — Ambulatory Visit: Admission: RE | Admit: 2022-08-10 | Payer: BC Managed Care – PPO | Source: Ambulatory Visit

## 2022-08-10 DIAGNOSIS — Z510 Encounter for antineoplastic radiation therapy: Secondary | ICD-10-CM | POA: Diagnosis not present

## 2022-08-11 ENCOUNTER — Other Ambulatory Visit: Payer: Self-pay

## 2022-08-11 ENCOUNTER — Ambulatory Visit
Admission: RE | Admit: 2022-08-11 | Discharge: 2022-08-11 | Disposition: A | Discharge status: Home / Self Care | Payer: BC Managed Care – PPO | Source: Ambulatory Visit | Attending: Radiation Oncology | Admitting: Radiation Oncology

## 2022-08-11 DIAGNOSIS — Z510 Encounter for antineoplastic radiation therapy: Secondary | ICD-10-CM | POA: Diagnosis not present

## 2022-08-11 LAB — RAD ONC ARIA SESSION SUMMARY
Course Elapsed Days: 0
Plan Fractions Treated to Date: 1
Plan Prescribed Dose Per Fraction: 2.66 Gy
Plan Total Fractions Prescribed: 16
Plan Total Prescribed Dose: 42.56 Gy
Reference Point Dosage Given to Date: 2.66 Gy
Reference Point Session Dosage Given: 2.66 Gy
Session Number: 1

## 2022-08-12 ENCOUNTER — Other Ambulatory Visit: Payer: Self-pay

## 2022-08-12 ENCOUNTER — Ambulatory Visit
Admission: RE | Admit: 2022-08-12 | Discharge: 2022-08-12 | Disposition: A | Discharge status: Home / Self Care | Payer: BC Managed Care – PPO | Source: Ambulatory Visit | Attending: Radiation Oncology | Admitting: Radiation Oncology

## 2022-08-12 DIAGNOSIS — Z510 Encounter for antineoplastic radiation therapy: Secondary | ICD-10-CM | POA: Diagnosis not present

## 2022-08-12 LAB — RAD ONC ARIA SESSION SUMMARY
Course Elapsed Days: 1
Plan Fractions Treated to Date: 2
Plan Prescribed Dose Per Fraction: 2.66 Gy
Plan Total Fractions Prescribed: 16
Plan Total Prescribed Dose: 42.56 Gy
Reference Point Dosage Given to Date: 5.32 Gy
Reference Point Session Dosage Given: 2.66 Gy
Session Number: 2

## 2022-08-13 ENCOUNTER — Ambulatory Visit
Admission: RE | Admit: 2022-08-13 | Discharge: 2022-08-13 | Disposition: A | Discharge status: Home / Self Care | Payer: BC Managed Care – PPO | Source: Ambulatory Visit | Attending: Radiation Oncology | Admitting: Radiation Oncology

## 2022-08-13 ENCOUNTER — Other Ambulatory Visit: Payer: Self-pay

## 2022-08-13 DIAGNOSIS — Z170 Estrogen receptor positive status [ER+]: Secondary | ICD-10-CM | POA: Insufficient documentation

## 2022-08-13 DIAGNOSIS — C50312 Malignant neoplasm of lower-inner quadrant of left female breast: Secondary | ICD-10-CM | POA: Insufficient documentation

## 2022-08-13 DIAGNOSIS — Z510 Encounter for antineoplastic radiation therapy: Secondary | ICD-10-CM | POA: Diagnosis not present

## 2022-08-13 LAB — RAD ONC ARIA SESSION SUMMARY
Course Elapsed Days: 2
Plan Fractions Treated to Date: 3
Plan Prescribed Dose Per Fraction: 2.66 Gy
Plan Total Fractions Prescribed: 16
Plan Total Prescribed Dose: 42.56 Gy
Reference Point Dosage Given to Date: 7.98 Gy
Reference Point Session Dosage Given: 2.66 Gy
Session Number: 3

## 2022-08-16 ENCOUNTER — Ambulatory Visit
Admission: RE | Admit: 2022-08-16 | Discharge: 2022-08-16 | Disposition: A | Discharge status: Home / Self Care | Payer: BC Managed Care – PPO | Source: Ambulatory Visit | Attending: Radiation Oncology | Admitting: Radiation Oncology

## 2022-08-16 ENCOUNTER — Encounter: Payer: Self-pay | Admitting: Internal Medicine

## 2022-08-16 ENCOUNTER — Inpatient Hospital Stay (HOSPITAL_BASED_OUTPATIENT_CLINIC_OR_DEPARTMENT_OTHER): Payer: BC Managed Care – PPO | Admitting: Internal Medicine

## 2022-08-16 ENCOUNTER — Other Ambulatory Visit: Payer: Self-pay

## 2022-08-16 VITALS — BP 119/84 | HR 74 | Temp 98.6°F | Resp 19 | Wt 177.2 lb

## 2022-08-16 DIAGNOSIS — Z803 Family history of malignant neoplasm of breast: Secondary | ICD-10-CM | POA: Insufficient documentation

## 2022-08-16 DIAGNOSIS — C50312 Malignant neoplasm of lower-inner quadrant of left female breast: Secondary | ICD-10-CM | POA: Insufficient documentation

## 2022-08-16 DIAGNOSIS — I10 Essential (primary) hypertension: Secondary | ICD-10-CM | POA: Insufficient documentation

## 2022-08-16 DIAGNOSIS — Z510 Encounter for antineoplastic radiation therapy: Secondary | ICD-10-CM | POA: Diagnosis not present

## 2022-08-16 DIAGNOSIS — Z170 Estrogen receptor positive status [ER+]: Secondary | ICD-10-CM | POA: Insufficient documentation

## 2022-08-16 DIAGNOSIS — Z8042 Family history of malignant neoplasm of prostate: Secondary | ICD-10-CM | POA: Insufficient documentation

## 2022-08-16 DIAGNOSIS — Z79899 Other long term (current) drug therapy: Secondary | ICD-10-CM | POA: Insufficient documentation

## 2022-08-16 LAB — RAD ONC ARIA SESSION SUMMARY
Course Elapsed Days: 5
Plan Fractions Treated to Date: 4
Plan Prescribed Dose Per Fraction: 2.66 Gy
Plan Total Fractions Prescribed: 16
Plan Total Prescribed Dose: 42.56 Gy
Reference Point Dosage Given to Date: 10.64 Gy
Reference Point Session Dosage Given: 2.66 Gy
Session Number: 4

## 2022-08-16 NOTE — Progress Notes (Signed)
Patient has no concerns today.

## 2022-08-16 NOTE — Progress Notes (Signed)
one [name] NOTE  Patient Care Team: Wayland Denis, PA-C as PCP - General (Physician Assistant) Daiva Huge, RN as Oncology Nurse Navigator  CHIEF COMPLAINTS/PURPOSE OF CONSULTATION: Breast cancer  #  Oncology History Overview Note  FINDINGS: A spiculated left breast mass is identified in the lower inner left breast, best seen on the cc view.   Targeted ultrasound is performed, showing a spiculated irregular mass in the left breast at 8 o'clock, 6 cm from the nipple measuring 19 by 10 x 13 mm, correlating with the mammographic finding. No axillary adenopathy.   IMPRESSION: Highly suspicious spiculated left breast mass at 8 o'clock identified mammographically and sonographically.   RECOMMENDATION: Recommend ultrasound-guided biopsy of the left breast mass.   I have discussed the findings and recommendations with the patient. If applicable, a reminder letter will be sent to the patient regarding the next appointment.   BI-RADS CATEGORY  5: Highly suggestive of malignancy.  BREAST, LEFT, 8:00; CORE BIOPSIES:   - INVASIVE MAMMARY CARCINOMA, NO SPECIAL TYPE.   Size of invasive carcinoma: 11.5 mm in this sample  Histologic grade of invasive carcinoma: Grade 2 (of 3)                       Glandular/tubular differentiation score: 3 (of 3)                       Nuclear pleomorphism score: 2 (of 3)                       Mitotic rate score: 1 (of 3)                       Total score: 6 (of 9)  Ductal carcinoma in situ: Present  Lymphovascular invasion: Not identified   ER/PR/HER2: Immunohistochemistry will be performed on block A1, with  reflex to [place] for HER2 2+. The results will be reported in an addendum.   Comment:  The definitive grade will be assigned on the excisional specimen.    B.  BREAST, LEFT, 9:00; BIOPSIES:   - INVASIVE MAMMARY CARCINOMA, NO SPECIAL TYPE.   Size of invasive carcinoma: 4.5 mm in this sample  Histologic grade of  invasive carcinoma: Grade 1 (of 3)                       Glandular/tubular differentiation score: 2 (of 3)                       Nuclear pleomorphism score: 2 (of 3)                       Mitotic rate score: 1 (of 3)                       Total score: 5 (of 9)  Ductal carcinoma in situ: Not identified  Lymphovascular invasion: Not identified   # JULY-AUG 2023 - LEFT BREAST   # 8 o'clock-11 mm-ER/PR positive HER2 negative #9:00-4.5 mm ER/PR positive HER2 negative.   # Ulcerative colitis [on mesalamine; Dr.Toledo]        [name], estrogen receptor positive ([place])  04/30/2022 Initial Diagnosis   [name], estrogen receptor positive ([place])   05/03/2022 Cancer Staging  Staging form: Breast, AJCC 8th Edition - Clinical: Stage IA (cT1c, cN0, cM0, G2, ER+, PR+, HER2-) - Signed by Cammie Sickle, MD on 05/03/2022 Stage prefix: Initial diagnosis Histologic grading system: 3 grade system      HISTORY OF PRESENTING ILLNESS: Ambulating independently.  Alone.   [name] 61 y.o.  female history of stage I breast cancer ER/PR positive HER2 negative status postlumpectomy stage reveals of the Oncotype.  Patient is currently getting radiation.  Denies any significant side effects.  No nausea no vomiting.  No fatigue.   Family history of breast cancer: Sister with breast cancer  Review of Systems  Constitutional:  Negative for chills, diaphoresis, fever, malaise/fatigue and weight loss.  HENT:  Negative for nosebleeds and sore throat.   Eyes:  Negative for double vision.  Respiratory:  Negative for cough, hemoptysis, sputum production, shortness of breath and wheezing.   Cardiovascular:  Negative for chest pain, palpitations, orthopnea and leg swelling.  Gastrointestinal:  Negative for abdominal pain, blood in stool, constipation, diarrhea, heartburn, melena, nausea and vomiting.   Genitourinary:  Negative for dysuria, frequency and urgency.  Musculoskeletal:  Negative for back pain and joint pain.  Skin: Negative.  Negative for itching and rash.  Neurological:  Negative for dizziness, tingling, focal weakness, weakness and headaches.  Endo/Heme/Allergies:  Does not bruise/bleed easily.  Psychiatric/Behavioral:  Negative for depression. The patient is not nervous/anxious and does not have insomnia.      MEDICAL HISTORY:  Past Medical History:  Diagnosis Date   Breast cancer ([place])    left   GERD (gastroesophageal reflux disease)    History of hiatal hernia    HLD (hyperlipidemia)    Hypertension    Ulcerative colitis ([place])     SURGICAL HISTORY: Past Surgical History:  Procedure Laterality Date   ABDOMINAL HYSTERECTOMY     AXILLARY SENTINEL NODE BIOPSY Left 07/05/2022   Procedure: AXILLARY SENTINEL NODE BIOPSY;  Surgeon: Robert Bellow, MD;  Location: [facility] ORS;  Service: General;  Laterality: Left;   BREAST BIOPSY Left 04/21/2022   [place] bx, path pending, ribbon marker   BREAST BIOPSY Left 04/21/2022   [place] bx, path pending, coil marker   BREAST BIOPSY Left 06/09/2022   Procedure: BREAST BIOPSY WITH NEEDLE LOCALIZATION;  Surgeon: Robert Bellow, MD;  Location: [facility] ORS;  Service: General;  Laterality: Left;   BREAST LUMPECTOMY WITH NEEDLE LOCALIZATION Left 07/05/2022   Procedure: BREAST LUMPECTOMY WITH NEEDLE LOCALIZATION;  Surgeon: Robert Bellow, MD;  Location: [facility] ORS;  Service: General;  Laterality: Left;   breast wire placement Left 07/05/2022   EYE MUSCLE SURGERY Left 09/14/1967   FRACTURE SURGERY Left 09/13/1972   arm   MANDIBLE SURGERY Bilateral 09/13/1977   UPPER GI ENDOSCOPY  09/13/2012   stretching of esophagus   WISDOM TOOTH EXTRACTION  09/14/1991   3 wisdom teeth    SOCIAL HISTORY: Social History   Socioeconomic History   Marital status: Single    Spouse name: Not on file   Number of children: Not on file   Years of  education: Not on file   Highest education level: Not on file  Occupational History   Not on file  Tobacco Use   Smoking status: Never   Smokeless tobacco: Not on file  Vaping Use   Vaping Use: Never used  Substance and Sexual Activity   Alcohol use: No   Drug use: No   Sexual activity: Not on file  Other Topics Concern  Not on file  Social History Narrative   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    Social Determinants of Health   Financial Resource Strain: Not on file  Food Insecurity: Not on file  Transportation Needs: Not on file  Physical Activity: Not on file  Stress: Not on file  Social Connections: Not on file  Intimate Partner Violence: Not on file    FAMILY HISTORY: Family History  Problem Relation Age of Onset   Diabetes Mother    Prostate cancer Father    Breast cancer Sister    Heart attack Brother    Breast cancer Cousin 60    ALLERGIES:  is allergic to amoxicillin.  MEDICATIONS:  Current Outpatient Medications  Medication Sig Dispense Refill   atorvastatin (LIPITOR) 10 MG tablet Take 10 mg by mouth every evening.     bisoprolol-hydrochlorothiazide (ZIAC) 5-6.25 MG tablet Take 1 tablet by mouth every morning.     mesalamine (LIALDA) 1.2 g EC tablet TAKE 1 TABLET (1.2 G TOTAL) BY MOUTH DAILY WITH BREAKFAST FOR 120 DAYS TAKE ONE TABLET DAILY AS PRESCRIBED.     Probiotic Product (ALIGN) 4 MG CAPS Take 1 capsule by mouth daily.     No current facility-administered medications for this visit.    PHYSICAL EXAMINATION: ECOG PERFORMANCE STATUS: 0 - Asymptomatic  Vitals:   08/16/22 1509  BP: 119/84  Pulse: 74  Resp: 19  Temp: 98.6 F (37 C)  SpO2: 99%   Filed Weights   08/16/22 1509  Weight: 177 lb 3.2 oz (80.4 kg)    Physical Exam Vitals and nursing note reviewed.  HENT:     Head: Normocephalic and atraumatic.     Mouth/Throat:     Pharynx: Oropharynx is clear.  Eyes:     Extraocular Movements:  Extraocular movements intact.     Pupils: Pupils are equal, round, and reactive to light.  Cardiovascular:     Rate and Rhythm: Normal rate and regular rhythm.  Pulmonary:     Comments: Decreased breath sounds bilaterally.  Abdominal:     Palpations: Abdomen is soft.  Musculoskeletal:        General: Normal range of motion.     Cervical back: Normal range of motion.  Skin:    General: Skin is warm.  Neurological:     General: No focal deficit present.     Mental Status: She is alert and oriented to person, place, and time.  Psychiatric:        Behavior: Behavior normal.        Judgment: Judgment normal.      LABORATORY DATA:  I have reviewed the data as listed Lab Results  Component Value Date   WBC 12.1 (H) 11/11/2015   HGB 8.9 (L) 11/11/2015   HCT 27.6 (L) 11/11/2015   MCV 78.1 (L) 11/11/2015   PLT 160 11/11/2015   No results for input(s): "NA", "K", "CL", "CO2", "GLUCOSE", "BUN", "CREATININE", "CALCIUM", "GFRNONAA", "GFRAA", "PROT", "ALBUMIN", "AST", "ALT", "ALKPHOS", "BILITOT", "BILIDIR", "IBILI" in the last 8760 hours.  RADIOGRAPHIC STUDIES: I have personally reviewed the radiological images as listed and agreed with the findings in the report. No results found.  ASSESSMENT & PLAN:   [name], estrogen receptor positive ([place]) # JULY-AUG 2023 - LEFT BREAST cancer- cTm1;N0 [Dr.Byrnett]  # 8 O'MVEHM-09 mm-ER/PR positive HER2 negative #9:00-4.5 mm ER/PR positive HER2 negative.      # I discussed the  role of endocrine therapy-given ER/PR positive disease postsurgery.  Patient will be offered antihormone pill 1 a day for 5 years.  Discussed potential downsides including but not limited to arthralgias hot flashes and osteoporosis.   # Bone health:   # Genetic counseling: # Genetics: Discussed with the patient majority of breast cancers are sporadic however 10 to 20% at risk of genetic/hereditary cancer syndromes.   Patient interested in genetic counseling.  We will make a referral-especially given the family history/sister with breast cancer.  # DISPOSITION: # follow up 2 month- MD; labs- cbc/cmp; vit D 44 OH; BMD prior-   Dr.B       All questions were answered. The patient/family knows to call the clinic with any problems, questions or concerns.       Cammie Sickle, MD 08/16/2022 6:53 PM

## 2022-08-16 NOTE — Assessment & Plan Note (Addendum)
#  July 2023-invasive mammary carcinoma right breast stage I ER/PR positive HER2 negative.  Oncotype-recurrence score 15; which translates to about 4% recurrence in 10 years while on endocrine therapy.  Reviewed the lack of any significant benefit from adjuvant chemotherapy.  # Patient will continue postlumpectomy radiation.  After finished radiation patient will follow-up with me to start endocrine therapy. Patient will be offered antihormone pill 1 a day for 5 years.  Discussed potential downsides including but not limited to arthralgias hot flashes and osteoporosis.   # Bone health: Discussed regarding bone health given her age and also given the need for aromatase inhibitor.  Patient is ordered a bone density test.  # Genetic counseling: # Genetics: Discussed with the patient majority of breast cancers are sporadic however 10 to 20% at risk of genetic/hereditary cancer syndromes.  Patient interested in genetic counseling.  This was canceled because of previous multiple appointments.  Will reinitiate at next visit.  # DISPOSITION: # follow up 2 month- MD; labs- cbc/cmp; vit D 12 OH; BMD prior-   Dr.B

## 2022-08-17 ENCOUNTER — Other Ambulatory Visit: Payer: Self-pay

## 2022-08-17 ENCOUNTER — Ambulatory Visit
Admission: RE | Admit: 2022-08-17 | Discharge: 2022-08-17 | Disposition: A | Discharge status: Home / Self Care | Payer: BC Managed Care – PPO | Source: Ambulatory Visit | Attending: Radiation Oncology | Admitting: Radiation Oncology

## 2022-08-17 DIAGNOSIS — Z510 Encounter for antineoplastic radiation therapy: Secondary | ICD-10-CM | POA: Diagnosis not present

## 2022-08-17 LAB — RAD ONC ARIA SESSION SUMMARY
Course Elapsed Days: 6
Plan Fractions Treated to Date: 5
Plan Prescribed Dose Per Fraction: 2.66 Gy
Plan Total Fractions Prescribed: 16
Plan Total Prescribed Dose: 42.56 Gy
Reference Point Dosage Given to Date: 13.3 Gy
Reference Point Session Dosage Given: 2.66 Gy
Session Number: 5

## 2022-08-18 ENCOUNTER — Ambulatory Visit
Admission: RE | Admit: 2022-08-18 | Discharge: 2022-08-18 | Disposition: A | Discharge status: Home / Self Care | Payer: BC Managed Care – PPO | Source: Ambulatory Visit | Attending: Radiation Oncology | Admitting: Radiation Oncology

## 2022-08-18 ENCOUNTER — Other Ambulatory Visit: Payer: Self-pay

## 2022-08-18 DIAGNOSIS — Z510 Encounter for antineoplastic radiation therapy: Secondary | ICD-10-CM | POA: Diagnosis not present

## 2022-08-18 LAB — RAD ONC ARIA SESSION SUMMARY
Course Elapsed Days: 7
Plan Fractions Treated to Date: 6
Plan Prescribed Dose Per Fraction: 2.66 Gy
Plan Total Fractions Prescribed: 16
Plan Total Prescribed Dose: 42.56 Gy
Reference Point Dosage Given to Date: 15.96 Gy
Reference Point Session Dosage Given: 2.66 Gy
Session Number: 6

## 2022-08-19 ENCOUNTER — Ambulatory Visit
Admission: RE | Admit: 2022-08-19 | Discharge: 2022-08-19 | Disposition: A | Discharge status: Home / Self Care | Payer: BC Managed Care – PPO | Source: Ambulatory Visit | Attending: Radiation Oncology | Admitting: Radiation Oncology

## 2022-08-19 ENCOUNTER — Other Ambulatory Visit: Payer: Self-pay

## 2022-08-19 ENCOUNTER — Other Ambulatory Visit: Payer: Self-pay | Admitting: *Deleted

## 2022-08-19 DIAGNOSIS — Z510 Encounter for antineoplastic radiation therapy: Secondary | ICD-10-CM | POA: Diagnosis not present

## 2022-08-19 DIAGNOSIS — Z170 Estrogen receptor positive status [ER+]: Secondary | ICD-10-CM

## 2022-08-19 LAB — RAD ONC ARIA SESSION SUMMARY
Course Elapsed Days: 8
Plan Fractions Treated to Date: 7
Plan Prescribed Dose Per Fraction: 2.66 Gy
Plan Total Fractions Prescribed: 16
Plan Total Prescribed Dose: 42.56 Gy
Reference Point Dosage Given to Date: 18.62 Gy
Reference Point Session Dosage Given: 2.66 Gy
Session Number: 7

## 2022-08-20 ENCOUNTER — Other Ambulatory Visit: Payer: Self-pay

## 2022-08-20 ENCOUNTER — Ambulatory Visit
Admission: RE | Admit: 2022-08-20 | Discharge: 2022-08-20 | Disposition: A | Discharge status: Home / Self Care | Payer: BC Managed Care – PPO | Source: Ambulatory Visit | Attending: Radiation Oncology | Admitting: Radiation Oncology

## 2022-08-20 DIAGNOSIS — Z510 Encounter for antineoplastic radiation therapy: Secondary | ICD-10-CM | POA: Diagnosis not present

## 2022-08-20 LAB — RAD ONC ARIA SESSION SUMMARY
Course Elapsed Days: 9
Plan Fractions Treated to Date: 8
Plan Prescribed Dose Per Fraction: 2.66 Gy
Plan Total Fractions Prescribed: 16
Plan Total Prescribed Dose: 42.56 Gy
Reference Point Dosage Given to Date: 21.28 Gy
Reference Point Session Dosage Given: 2.66 Gy
Session Number: 8

## 2022-08-23 ENCOUNTER — Ambulatory Visit
Admission: RE | Admit: 2022-08-23 | Discharge: 2022-08-23 | Disposition: A | Discharge status: Home / Self Care | Payer: BC Managed Care – PPO | Source: Ambulatory Visit | Attending: Radiation Oncology | Admitting: Radiation Oncology

## 2022-08-23 ENCOUNTER — Other Ambulatory Visit: Payer: Self-pay

## 2022-08-23 DIAGNOSIS — Z510 Encounter for antineoplastic radiation therapy: Secondary | ICD-10-CM | POA: Diagnosis not present

## 2022-08-23 LAB — RAD ONC ARIA SESSION SUMMARY
Course Elapsed Days: 12
Plan Fractions Treated to Date: 9
Plan Prescribed Dose Per Fraction: 2.66 Gy
Plan Total Fractions Prescribed: 16
Plan Total Prescribed Dose: 42.56 Gy
Reference Point Dosage Given to Date: 23.94 Gy
Reference Point Session Dosage Given: 2.66 Gy
Session Number: 9

## 2022-08-24 ENCOUNTER — Inpatient Hospital Stay: Payer: BC Managed Care – PPO

## 2022-08-24 ENCOUNTER — Other Ambulatory Visit: Payer: Self-pay

## 2022-08-24 ENCOUNTER — Ambulatory Visit
Admission: RE | Admit: 2022-08-24 | Discharge: 2022-08-24 | Disposition: A | Discharge status: Home / Self Care | Payer: BC Managed Care – PPO | Source: Ambulatory Visit | Attending: Radiation Oncology | Admitting: Radiation Oncology

## 2022-08-24 DIAGNOSIS — Z510 Encounter for antineoplastic radiation therapy: Secondary | ICD-10-CM | POA: Diagnosis not present

## 2022-08-24 DIAGNOSIS — Z170 Estrogen receptor positive status [ER+]: Secondary | ICD-10-CM

## 2022-08-24 LAB — CBC
HCT: 43 % (ref 36.0–46.0)
Hemoglobin: 14.4 g/dL (ref 12.0–15.0)
MCH: 30.4 pg (ref 26.0–34.0)
MCHC: 33.5 g/dL (ref 30.0–36.0)
MCV: 90.7 fL (ref 80.0–100.0)
Platelets: 166 10*3/uL (ref 150–400)
RBC: 4.74 MIL/uL (ref 3.87–5.11)
RDW: 12.5 % (ref 11.5–15.5)
WBC: 8.2 10*3/uL (ref 4.0–10.5)
nRBC: 0 % (ref 0.0–0.2)

## 2022-08-24 LAB — RAD ONC ARIA SESSION SUMMARY
Course Elapsed Days: 13
Plan Fractions Treated to Date: 10
Plan Prescribed Dose Per Fraction: 2.66 Gy
Plan Total Fractions Prescribed: 16
Plan Total Prescribed Dose: 42.56 Gy
Reference Point Dosage Given to Date: 26.6 Gy
Reference Point Session Dosage Given: 2.66 Gy
Session Number: 10

## 2022-08-25 ENCOUNTER — Ambulatory Visit
Admission: RE | Admit: 2022-08-25 | Discharge: 2022-08-25 | Disposition: A | Discharge status: Home / Self Care | Payer: BC Managed Care – PPO | Source: Ambulatory Visit | Attending: Radiation Oncology | Admitting: Radiation Oncology

## 2022-08-25 ENCOUNTER — Other Ambulatory Visit: Payer: Self-pay

## 2022-08-25 DIAGNOSIS — Z510 Encounter for antineoplastic radiation therapy: Secondary | ICD-10-CM | POA: Diagnosis not present

## 2022-08-25 LAB — RAD ONC ARIA SESSION SUMMARY
Course Elapsed Days: 14
Plan Fractions Treated to Date: 11
Plan Prescribed Dose Per Fraction: 2.66 Gy
Plan Total Fractions Prescribed: 16
Plan Total Prescribed Dose: 42.56 Gy
Reference Point Dosage Given to Date: 29.26 Gy
Reference Point Session Dosage Given: 2.66 Gy
Session Number: 11

## 2022-08-26 ENCOUNTER — Ambulatory Visit
Admission: RE | Admit: 2022-08-26 | Discharge: 2022-08-26 | Disposition: A | Discharge status: Home / Self Care | Payer: BC Managed Care – PPO | Source: Ambulatory Visit | Attending: Radiation Oncology | Admitting: Radiation Oncology

## 2022-08-26 ENCOUNTER — Other Ambulatory Visit: Payer: Self-pay

## 2022-08-26 DIAGNOSIS — Z510 Encounter for antineoplastic radiation therapy: Secondary | ICD-10-CM | POA: Diagnosis not present

## 2022-08-26 LAB — RAD ONC ARIA SESSION SUMMARY
Course Elapsed Days: 15
Plan Fractions Treated to Date: 12
Plan Prescribed Dose Per Fraction: 2.66 Gy
Plan Total Fractions Prescribed: 16
Plan Total Prescribed Dose: 42.56 Gy
Reference Point Dosage Given to Date: 31.92 Gy
Reference Point Session Dosage Given: 2.66 Gy
Session Number: 12

## 2022-08-27 ENCOUNTER — Other Ambulatory Visit: Payer: Self-pay

## 2022-08-27 ENCOUNTER — Ambulatory Visit
Admission: RE | Admit: 2022-08-27 | Discharge: 2022-08-27 | Disposition: A | Discharge status: Home / Self Care | Payer: BC Managed Care – PPO | Source: Ambulatory Visit | Attending: Radiation Oncology | Admitting: Radiation Oncology

## 2022-08-27 DIAGNOSIS — Z510 Encounter for antineoplastic radiation therapy: Secondary | ICD-10-CM | POA: Diagnosis not present

## 2022-08-27 LAB — RAD ONC ARIA SESSION SUMMARY
Course Elapsed Days: 16
Plan Fractions Treated to Date: 13
Plan Prescribed Dose Per Fraction: 2.66 Gy
Plan Total Fractions Prescribed: 16
Plan Total Prescribed Dose: 42.56 Gy
Reference Point Dosage Given to Date: 34.58 Gy
Reference Point Session Dosage Given: 2.66 Gy
Session Number: 13

## 2022-08-28 DIAGNOSIS — Z510 Encounter for antineoplastic radiation therapy: Secondary | ICD-10-CM | POA: Diagnosis not present

## 2022-08-30 ENCOUNTER — Ambulatory Visit
Admission: RE | Admit: 2022-08-30 | Discharge: 2022-08-30 | Disposition: A | Discharge status: Home / Self Care | Payer: BC Managed Care – PPO | Source: Ambulatory Visit | Attending: Radiation Oncology | Admitting: Radiation Oncology

## 2022-08-30 ENCOUNTER — Other Ambulatory Visit: Payer: Self-pay

## 2022-08-30 DIAGNOSIS — Z510 Encounter for antineoplastic radiation therapy: Secondary | ICD-10-CM | POA: Diagnosis not present

## 2022-08-30 LAB — RAD ONC ARIA SESSION SUMMARY
Course Elapsed Days: 19
Plan Fractions Treated to Date: 14
Plan Prescribed Dose Per Fraction: 2.66 Gy
Plan Total Fractions Prescribed: 16
Plan Total Prescribed Dose: 42.56 Gy
Reference Point Dosage Given to Date: 37.24 Gy
Reference Point Session Dosage Given: 2.66 Gy
Session Number: 14

## 2022-08-31 ENCOUNTER — Other Ambulatory Visit: Payer: Self-pay

## 2022-08-31 ENCOUNTER — Ambulatory Visit
Admission: RE | Admit: 2022-08-31 | Discharge: 2022-08-31 | Disposition: A | Discharge status: Home / Self Care | Payer: BC Managed Care – PPO | Source: Ambulatory Visit | Attending: Radiation Oncology | Admitting: Radiation Oncology

## 2022-08-31 DIAGNOSIS — Z510 Encounter for antineoplastic radiation therapy: Secondary | ICD-10-CM | POA: Diagnosis not present

## 2022-08-31 LAB — RAD ONC ARIA SESSION SUMMARY
Course Elapsed Days: 20
Plan Fractions Treated to Date: 15
Plan Prescribed Dose Per Fraction: 2.66 Gy
Plan Total Fractions Prescribed: 16
Plan Total Prescribed Dose: 42.56 Gy
Reference Point Dosage Given to Date: 39.9 Gy
Reference Point Session Dosage Given: 2.66 Gy
Session Number: 15

## 2022-09-01 ENCOUNTER — Ambulatory Visit
Admission: RE | Admit: 2022-09-01 | Discharge: 2022-09-01 | Disposition: A | Discharge status: Home / Self Care | Payer: BC Managed Care – PPO | Source: Ambulatory Visit | Attending: Radiation Oncology | Admitting: Radiation Oncology

## 2022-09-01 ENCOUNTER — Other Ambulatory Visit: Payer: Self-pay

## 2022-09-01 ENCOUNTER — Ambulatory Visit: Admission: RE | Admit: 2022-09-01 | Payer: BC Managed Care – PPO | Source: Ambulatory Visit

## 2022-09-01 DIAGNOSIS — Z510 Encounter for antineoplastic radiation therapy: Secondary | ICD-10-CM | POA: Diagnosis not present

## 2022-09-01 LAB — RAD ONC ARIA SESSION SUMMARY
Course Elapsed Days: 21
Plan Fractions Treated to Date: 16
Plan Prescribed Dose Per Fraction: 2.66 Gy
Plan Total Fractions Prescribed: 16
Plan Total Prescribed Dose: 42.56 Gy
Reference Point Dosage Given to Date: 42.56 Gy
Reference Point Session Dosage Given: 2.66 Gy
Session Number: 16

## 2022-09-02 ENCOUNTER — Ambulatory Visit
Admission: RE | Admit: 2022-09-02 | Discharge: 2022-09-02 | Disposition: A | Discharge status: Home / Self Care | Payer: BC Managed Care – PPO | Source: Ambulatory Visit | Attending: Radiation Oncology | Admitting: Radiation Oncology

## 2022-09-02 ENCOUNTER — Other Ambulatory Visit: Payer: Self-pay

## 2022-09-02 DIAGNOSIS — Z510 Encounter for antineoplastic radiation therapy: Secondary | ICD-10-CM | POA: Diagnosis not present

## 2022-09-02 LAB — RAD ONC ARIA SESSION SUMMARY
Course Elapsed Days: 22
Plan Fractions Treated to Date: 1
Plan Prescribed Dose Per Fraction: 2 Gy
Plan Total Fractions Prescribed: 8
Plan Total Prescribed Dose: 16 Gy
Reference Point Dosage Given to Date: 2 Gy
Reference Point Session Dosage Given: 2 Gy
Session Number: 17

## 2022-09-03 ENCOUNTER — Other Ambulatory Visit: Payer: Self-pay

## 2022-09-03 ENCOUNTER — Ambulatory Visit
Admission: RE | Admit: 2022-09-03 | Discharge: 2022-09-03 | Disposition: A | Discharge status: Home / Self Care | Payer: BC Managed Care – PPO | Source: Ambulatory Visit | Attending: Radiation Oncology | Admitting: Radiation Oncology

## 2022-09-03 DIAGNOSIS — Z510 Encounter for antineoplastic radiation therapy: Secondary | ICD-10-CM | POA: Diagnosis not present

## 2022-09-03 LAB — RAD ONC ARIA SESSION SUMMARY
Course Elapsed Days: 23
Plan Fractions Treated to Date: 2
Plan Prescribed Dose Per Fraction: 2 Gy
Plan Total Fractions Prescribed: 8
Plan Total Prescribed Dose: 16 Gy
Reference Point Dosage Given to Date: 4 Gy
Reference Point Session Dosage Given: 2 Gy
Session Number: 18

## 2022-09-07 ENCOUNTER — Inpatient Hospital Stay: Payer: BC Managed Care – PPO

## 2022-09-07 ENCOUNTER — Other Ambulatory Visit: Payer: Self-pay

## 2022-09-07 ENCOUNTER — Ambulatory Visit
Admission: RE | Admit: 2022-09-07 | Discharge: 2022-09-07 | Disposition: A | Discharge status: Home / Self Care | Payer: BC Managed Care – PPO | Source: Ambulatory Visit | Attending: Radiation Oncology | Admitting: Radiation Oncology

## 2022-09-07 DIAGNOSIS — Z510 Encounter for antineoplastic radiation therapy: Secondary | ICD-10-CM | POA: Diagnosis not present

## 2022-09-07 DIAGNOSIS — Z170 Estrogen receptor positive status [ER+]: Secondary | ICD-10-CM

## 2022-09-07 LAB — CBC
HCT: 41.8 % (ref 36.0–46.0)
Hemoglobin: 14.3 g/dL (ref 12.0–15.0)
MCH: 30.9 pg (ref 26.0–34.0)
MCHC: 34.2 g/dL (ref 30.0–36.0)
MCV: 90.3 fL (ref 80.0–100.0)
Platelets: 145 10*3/uL — ABNORMAL LOW (ref 150–400)
RBC: 4.63 MIL/uL (ref 3.87–5.11)
RDW: 12.7 % (ref 11.5–15.5)
WBC: 8.4 10*3/uL (ref 4.0–10.5)
nRBC: 0 % (ref 0.0–0.2)

## 2022-09-07 LAB — RAD ONC ARIA SESSION SUMMARY
Course Elapsed Days: 27
Plan Fractions Treated to Date: 3
Plan Prescribed Dose Per Fraction: 2 Gy
Plan Total Fractions Prescribed: 8
Plan Total Prescribed Dose: 16 Gy
Reference Point Dosage Given to Date: 6 Gy
Reference Point Session Dosage Given: 2 Gy
Session Number: 19

## 2022-09-08 ENCOUNTER — Ambulatory Visit
Admission: RE | Admit: 2022-09-08 | Discharge: 2022-09-08 | Disposition: A | Discharge status: Home / Self Care | Payer: BC Managed Care – PPO | Source: Ambulatory Visit | Attending: Radiation Oncology | Admitting: Radiation Oncology

## 2022-09-08 ENCOUNTER — Other Ambulatory Visit: Payer: Self-pay

## 2022-09-08 DIAGNOSIS — Z510 Encounter for antineoplastic radiation therapy: Secondary | ICD-10-CM | POA: Diagnosis not present

## 2022-09-08 LAB — RAD ONC ARIA SESSION SUMMARY
Course Elapsed Days: 28
Plan Fractions Treated to Date: 4
Plan Prescribed Dose Per Fraction: 2 Gy
Plan Total Fractions Prescribed: 8
Plan Total Prescribed Dose: 16 Gy
Reference Point Dosage Given to Date: 8 Gy
Reference Point Session Dosage Given: 2 Gy
Session Number: 20

## 2022-09-09 ENCOUNTER — Other Ambulatory Visit: Payer: Self-pay

## 2022-09-09 ENCOUNTER — Ambulatory Visit
Admission: RE | Admit: 2022-09-09 | Discharge: 2022-09-09 | Disposition: A | Discharge status: Home / Self Care | Payer: BC Managed Care – PPO | Source: Ambulatory Visit | Attending: Radiation Oncology | Admitting: Radiation Oncology

## 2022-09-09 DIAGNOSIS — Z510 Encounter for antineoplastic radiation therapy: Secondary | ICD-10-CM | POA: Diagnosis not present

## 2022-09-09 LAB — RAD ONC ARIA SESSION SUMMARY
Course Elapsed Days: 29
Plan Fractions Treated to Date: 5
Plan Prescribed Dose Per Fraction: 2 Gy
Plan Total Fractions Prescribed: 8
Plan Total Prescribed Dose: 16 Gy
Reference Point Dosage Given to Date: 10 Gy
Reference Point Session Dosage Given: 2 Gy
Session Number: 21

## 2022-09-10 ENCOUNTER — Other Ambulatory Visit: Payer: Self-pay

## 2022-09-10 ENCOUNTER — Ambulatory Visit
Admission: RE | Admit: 2022-09-10 | Discharge: 2022-09-10 | Disposition: A | Discharge status: Home / Self Care | Payer: BC Managed Care – PPO | Source: Ambulatory Visit | Attending: Radiation Oncology | Admitting: Radiation Oncology

## 2022-09-10 DIAGNOSIS — Z510 Encounter for antineoplastic radiation therapy: Secondary | ICD-10-CM | POA: Diagnosis not present

## 2022-09-10 LAB — RAD ONC ARIA SESSION SUMMARY
Course Elapsed Days: 30
Plan Fractions Treated to Date: 6
Plan Prescribed Dose Per Fraction: 2 Gy
Plan Total Fractions Prescribed: 8
Plan Total Prescribed Dose: 16 Gy
Reference Point Dosage Given to Date: 12 Gy
Reference Point Session Dosage Given: 2 Gy
Session Number: 22

## 2022-09-14 ENCOUNTER — Ambulatory Visit
Admission: RE | Admit: 2022-09-14 | Discharge: 2022-09-14 | Disposition: A | Discharge status: Home / Self Care | Payer: BC Managed Care – PPO | Source: Ambulatory Visit | Attending: Radiation Oncology | Admitting: Radiation Oncology

## 2022-09-14 ENCOUNTER — Other Ambulatory Visit: Payer: Self-pay

## 2022-09-14 DIAGNOSIS — C50312 Malignant neoplasm of lower-inner quadrant of left female breast: Secondary | ICD-10-CM | POA: Insufficient documentation

## 2022-09-14 DIAGNOSIS — Z170 Estrogen receptor positive status [ER+]: Secondary | ICD-10-CM | POA: Diagnosis present

## 2022-09-14 LAB — RAD ONC ARIA SESSION SUMMARY
Course Elapsed Days: 34
Plan Fractions Treated to Date: 7
Plan Prescribed Dose Per Fraction: 2 Gy
Plan Total Fractions Prescribed: 8
Plan Total Prescribed Dose: 16 Gy
Reference Point Dosage Given to Date: 14 Gy
Reference Point Session Dosage Given: 2 Gy
Session Number: 23

## 2022-09-15 ENCOUNTER — Ambulatory Visit
Admission: RE | Admit: 2022-09-15 | Discharge: 2022-09-15 | Disposition: A | Discharge status: Home / Self Care | Payer: BC Managed Care – PPO | Source: Ambulatory Visit | Attending: Radiation Oncology | Admitting: Radiation Oncology

## 2022-09-15 ENCOUNTER — Other Ambulatory Visit: Payer: Self-pay

## 2022-09-15 ENCOUNTER — Encounter: Payer: Self-pay | Admitting: *Deleted

## 2022-09-15 DIAGNOSIS — C50312 Malignant neoplasm of lower-inner quadrant of left female breast: Secondary | ICD-10-CM | POA: Diagnosis not present

## 2022-09-15 LAB — RAD ONC ARIA SESSION SUMMARY
Course Elapsed Days: 35
Plan Fractions Treated to Date: 8
Plan Prescribed Dose Per Fraction: 2 Gy
Plan Total Fractions Prescribed: 8
Plan Total Prescribed Dose: 16 Gy
Reference Point Dosage Given to Date: 16 Gy
Reference Point Session Dosage Given: 2 Gy
Session Number: 24

## 2022-10-13 ENCOUNTER — Ambulatory Visit
Admission: RE | Admit: 2022-10-13 | Discharge: 2022-10-13 | Disposition: A | Discharge status: Home / Self Care | Payer: BC Managed Care – PPO | Source: Ambulatory Visit | Attending: Radiation Oncology | Admitting: Radiation Oncology

## 2022-10-13 ENCOUNTER — Encounter: Payer: Self-pay | Admitting: Radiation Oncology

## 2022-10-13 VITALS — BP 112/72 | HR 77 | Temp 98.7°F | Resp 20 | Ht 64.0 in | Wt 178.0 lb

## 2022-10-13 DIAGNOSIS — C50312 Malignant neoplasm of lower-inner quadrant of left female breast: Secondary | ICD-10-CM | POA: Diagnosis not present

## 2022-10-13 DIAGNOSIS — Z170 Estrogen receptor positive status [ER+]: Secondary | ICD-10-CM

## 2022-10-13 NOTE — Progress Notes (Signed)
Radiation Oncology Follow up Note  Name: [name]   Date:   10/13/2022 MRN:  [id number] DOB: Aug 09, 1961    This 62 y.o. female presents to the clinic today for 1 month follow-up status post whole breast radiation to her left breast for stage Ia ER/PR positive invasive mammary carcinoma.  REFERRING PROVIDER: Wayland Denis, PA-C  HPI: Patient is a 62 year old female now out 1 month having completed whole breast radiation to her left breast for stage Ia ER/PR positive invasive mammary carcinoma status post wide local excision.  She is seen today in routine follow-up and is doing well.  Specifically denies breast tenderness cough or bone pain.  She is seeing Dr. B early next week for discussion of endocrine therapy..  COMPLICATIONS OF TREATMENT: none  FOLLOW UP COMPLIANCE: keeps appointments   PHYSICAL EXAM:  BP 112/72 (BP Location: Right Arm, Patient Position: Sitting, Cuff Size: Normal)   Pulse 77   Temp 98.7 F (37.1 C) (Tympanic)   Resp 20   Ht '5\' 4"'$  (1.626 m)   Wt 178 lb (80.7 kg)   LMP 11/01/2015   BMI 30.55 kg/m  Lungs are clear to A&P cardiac examination essentially unremarkable with regular rate and rhythm. No dominant mass or nodularity is noted in either breast in 2 positions examined. Incision is well-healed. No axillary or supraclavicular adenopathy is appreciated. Cosmetic result is excellent.  Some slight shrinkage of the left breast although cosmetic result is still good to excellent.  Well-developed well-nourished patient in NAD. HEENT reveals PERLA, EOMI, discs not visualized.  Oral cavity is clear. No oral mucosal lesions are identified. Neck is clear without evidence of cervical or supraclavicular adenopathy. Lungs are clear to A&P. Cardiac examination is essentially unremarkable with regular rate and rhythm without murmur rub or thrill. Abdomen is benign with no organomegaly or masses noted. Motor sensory and DTR levels are equal and symmetric in the upper and  lower extremities. Cranial nerves II through XII are grossly intact. Proprioception is intact. No peripheral adenopathy or edema is identified. No motor or sensory levels are noted. Crude visual fields are within normal range.  RADIOLOGY RESULTS: No current films for review  PLAN: The present time patient is doing well 1 month out from whole breast radiation and pleased with her overall progress.  She will see Dr. B early next week for discussion of endocrine therapy.  I otherwise asked to see her back in 6 months for follow-up.  Patient is to call with any concerns.  I would like to take this opportunity to thank you for allowing me to participate in the care of your patient.Noreene Filbert, MD

## 2022-10-18 ENCOUNTER — Inpatient Hospital Stay (HOSPITAL_BASED_OUTPATIENT_CLINIC_OR_DEPARTMENT_OTHER): Payer: BC Managed Care – PPO | Admitting: Internal Medicine

## 2022-10-18 ENCOUNTER — Encounter: Payer: Self-pay | Admitting: Internal Medicine

## 2022-10-18 ENCOUNTER — Inpatient Hospital Stay: Payer: BC Managed Care – PPO | Attending: Internal Medicine

## 2022-10-18 DIAGNOSIS — Z170 Estrogen receptor positive status [ER+]: Secondary | ICD-10-CM

## 2022-10-18 DIAGNOSIS — C50312 Malignant neoplasm of lower-inner quadrant of left female breast: Secondary | ICD-10-CM | POA: Insufficient documentation

## 2022-10-18 DIAGNOSIS — Z803 Family history of malignant neoplasm of breast: Secondary | ICD-10-CM | POA: Diagnosis not present

## 2022-10-18 DIAGNOSIS — E876 Hypokalemia: Secondary | ICD-10-CM | POA: Insufficient documentation

## 2022-10-18 DIAGNOSIS — Z79811 Long term (current) use of aromatase inhibitors: Secondary | ICD-10-CM | POA: Insufficient documentation

## 2022-10-18 LAB — COMPREHENSIVE METABOLIC PANEL
ALT: 18 U/L (ref 0–44)
AST: 21 U/L (ref 15–41)
Albumin: 3.7 g/dL (ref 3.5–5.0)
Alkaline Phosphatase: 117 U/L (ref 38–126)
Anion gap: 11 (ref 5–15)
BUN: 12 mg/dL (ref 6–20)
CO2: 24 mmol/L (ref 22–32)
Calcium: 10 mg/dL (ref 8.9–10.3)
Chloride: 101 mmol/L (ref 98–111)
Creatinine, Ser: 0.87 mg/dL (ref 0.44–1.00)
GFR, Estimated: >60 mL/min (ref >60)
Glucose, Bld: 139 mg/dL — ABNORMAL HIGH (ref 70–99)
Potassium: 3.2 mmol/L — ABNORMAL LOW (ref 3.5–5.1)
Sodium: 136 mmol/L (ref 135–145)
Total Bilirubin: 0.5 mg/dL (ref 0.3–1.2)
Total Protein: 6.9 g/dL (ref 6.5–8.1)

## 2022-10-18 LAB — CBC WITH DIFFERENTIAL/PLATELET
Abs Immature Granulocytes: 0.03 10*3/uL (ref 0.00–0.07)
Basophils Absolute: 0.1 10*3/uL (ref 0.0–0.1)
Basophils Relative: 1 %
Eosinophils Absolute: 0.5 10*3/uL (ref 0.0–0.5)
Eosinophils Relative: 7 %
HCT: 42.1 % (ref 36.0–46.0)
Hemoglobin: 14.4 g/dL (ref 12.0–15.0)
Immature Granulocytes: 0 %
Lymphocytes Relative: 11 %
Lymphs Abs: 0.8 10*3/uL (ref 0.7–4.0)
MCH: 30.7 pg (ref 26.0–34.0)
MCHC: 34.2 g/dL (ref 30.0–36.0)
MCV: 89.8 fL (ref 80.0–100.0)
Monocytes Absolute: 0.6 10*3/uL (ref 0.1–1.0)
Monocytes Relative: 7 %
Neutro Abs: 5.8 10*3/uL (ref 1.7–7.7)
Neutrophils Relative %: 74 %
Platelets: 182 10*3/uL (ref 150–400)
RBC: 4.69 MIL/uL (ref 3.87–5.11)
RDW: 13.1 % (ref 11.5–15.5)
WBC: 7.8 10*3/uL (ref 4.0–10.5)
nRBC: 0 % (ref 0.0–0.2)

## 2022-10-18 MED ORDER — ANASTROZOLE 1 MG PO TABS: 1.0000 mg | ORAL_TABLET | Freq: Every day | ORAL | 4 refills | Status: DC

## 2022-10-18 NOTE — Progress Notes (Signed)
Patient denies new problems/concerns today.    BMD scheduled for 10/21/22

## 2022-10-18 NOTE — Patient Instructions (Signed)
#   Recommend calcium 1200 plus vitamin D-3 1000-over-the-counter-1 pill a day.  

## 2022-10-18 NOTE — Assessment & Plan Note (Addendum)
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  Oncotype-recurrence score 15; which translates to about 4% recurrence in 10 years while on endocrine therapy. Patient s/p postlumpectomy radiation.   # Patient will be offered antihormone pill 1 a day for 5 years.  Discussed potential downsides including but not limited to arthralgias hot flashes and osteoporosis. Recommend calcium 1200 plus vitamin D-3 1000-over-the-counter-1 pill a day.     # Hypokalemia: K 3.2; recommend dietary suppl. [Hx of UC]  # Hx of UC- ? Flare up- monitor for now/defer to GI.   # Bone health: Discussed regarding bone health given her age and also given the need for aromatase inhibitor.  FEB, 2024- bone density test.  # Genetic counseling: # Genetics: Discussed with the patient majority of breast cancers are sporadic however 10 to 20% at risk of genetic/hereditary cancer syndromes.  Patient interested in genetic counseling.  Patient wants to wait.   # Vaccination: ok with COVID shot.   # DISPOSITION: # follow up 2 month- MD- no labs-  Dr.B

## 2022-10-18 NOTE — Progress Notes (Signed)
[facility] NOTE  Patient Care Team: Wayland Denis, PA-C as PCP - General (Physician Assistant) Daiva Huge, RN as Oncology Nurse Navigator Cammie Sickle, MD as Consulting Physician (Internal Medicine)  CHIEF COMPLAINTS/PURPOSE OF CONSULTATION: Breast cancer  #  Oncology History Overview Note  FINDINGS: A spiculated left breast mass is identified in the lower inner left breast, best seen on the cc view.   Targeted ultrasound is performed, showing a spiculated irregular mass in the left breast at 8 o'clock, 6 cm from the nipple measuring 19 by 10 x 13 mm, correlating with the mammographic finding. No axillary adenopathy.   IMPRESSION: Highly suspicious spiculated left breast mass at 8 o'clock identified mammographically and sonographically.   RECOMMENDATION: Recommend ultrasound-guided biopsy of the left breast mass.   I have discussed the findings and recommendations with the patient. If applicable, a reminder letter will be sent to the patient regarding the next appointment.   BI-RADS CATEGORY  5: Highly suggestive of malignancy.  BREAST, LEFT, 8:00; CORE BIOPSIES:   - INVASIVE MAMMARY CARCINOMA, NO SPECIAL TYPE.   Size of invasive carcinoma: 11.5 mm in this sample  Histologic grade of invasive carcinoma: Grade 2 (of 3)                       Glandular/tubular differentiation score: 3 (of 3)                       Nuclear pleomorphism score: 2 (of 3)                       Mitotic rate score: 1 (of 3)                       Total score: 6 (of 9)  Ductal carcinoma in situ: Present  Lymphovascular invasion: Not identified   ER/PR/HER2: Immunohistochemistry will be performed on block A1, with  reflex to [place] for HER2 2+. The results will be reported in an addendum.   Comment:  The definitive grade will be assigned on the excisional specimen.    B.  BREAST, LEFT, 9:00; BIOPSIES:   - INVASIVE MAMMARY CARCINOMA, NO SPECIAL TYPE.   Size  of invasive carcinoma: 4.5 mm in this sample  Histologic grade of invasive carcinoma: Grade 1 (of 3)                       Glandular/tubular differentiation score: 2 (of 3)                       Nuclear pleomorphism score: 2 (of 3)                       Mitotic rate score: 1 (of 3)                       Total score: 5 (of 9)  Ductal carcinoma in situ: Not identified  Lymphovascular invasion: Not identified   # JULY-AUG 2023 - LEFT BREAST   # 8 o'clock-11 mm-ER/PR positive HER2 negative #9:00-4.5 mm ER/PR positive HER2 negative.   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  Alone.   [name] 62 y.o.  female history of stage I breast cancer ER/PR positive HER2 negative status postlumpectomy-low risk Oncotype currently s/p radiation is here for follow-up.  Patient denies new problems/concerns today.  BMD scheduled for 10/21/22  Denies any significant side effects.  No nausea no vomiting.  No fatigue.  Review of Systems  Constitutional:  Negative for chills, diaphoresis, fever, malaise/fatigue and weight loss.  HENT:  Negative for nosebleeds and sore throat.   Eyes:  Negative for double vision.  Respiratory:  Negative for cough, hemoptysis, sputum production, shortness of breath and wheezing.   Cardiovascular:  Negative for chest pain, palpitations, orthopnea and leg swelling.  Gastrointestinal:  Negative for abdominal pain, blood in stool,  constipation, diarrhea, heartburn, melena, nausea and vomiting.  Genitourinary:  Negative for dysuria, frequency and urgency.  Musculoskeletal:  Negative for back pain and joint pain.  Skin: Negative.  Negative for itching and rash.  Neurological:  Negative for dizziness, tingling, focal weakness, weakness and headaches.  Endo/Heme/Allergies:  Does not bruise/bleed easily.  Psychiatric/Behavioral:  Negative for depression. The patient is not nervous/anxious and does not have insomnia.      MEDICAL HISTORY:  Past Medical History:  Diagnosis Date   Breast cancer ([place])    left   GERD (gastroesophageal reflux disease)    History of hiatal hernia    HLD (hyperlipidemia)    Hypertension    Ulcerative colitis ([place])     SURGICAL HISTORY: Past Surgical History:  Procedure Laterality Date   ABDOMINAL HYSTERECTOMY     AXILLARY SENTINEL NODE BIOPSY Left 07/05/2022   Procedure: AXILLARY SENTINEL NODE BIOPSY;  Surgeon: Robert Bellow, MD;  Location: [facility] ORS;  Service: General;  Laterality: Left;   BREAST BIOPSY Left 04/21/2022   [place] bx, path pending, ribbon marker   BREAST BIOPSY Left 04/21/2022   [place] bx, path pending, coil marker   BREAST BIOPSY Left 06/09/2022   Procedure: BREAST BIOPSY WITH NEEDLE LOCALIZATION;  Surgeon: Robert Bellow, MD;  Location: [facility] ORS;  Service: General;  Laterality: Left;   BREAST LUMPECTOMY WITH NEEDLE LOCALIZATION Left 07/05/2022   Procedure: BREAST LUMPECTOMY WITH NEEDLE LOCALIZATION;  Surgeon: Robert Bellow, MD;  Location: [facility] ORS;  Service: General;  Laterality: Left;   breast wire placement Left 07/05/2022   EYE MUSCLE SURGERY Left 09/14/1967   FRACTURE SURGERY Left 09/13/1972   arm   MANDIBLE SURGERY Bilateral 09/13/1977   UPPER GI ENDOSCOPY  09/13/2012   stretching of esophagus   WISDOM TOOTH EXTRACTION  09/14/1991   3 wisdom teeth    SOCIAL HISTORY: Social History   Socioeconomic History   Marital status: Single    Spouse name:  Not on file   Number of children: Not on file   Years of education: Not on file   Highest education level: Not on file  Occupational History   Not on file  Tobacco Use   Smoking status: Never   Smokeless tobacco: Not on file  Vaping Use   Vaping Use: Never used  Substance and Sexual Activity   Alcohol use: No   Drug use: No   Sexual  activity: Not on file  Other Topics Concern   Not on file  Social History Narrative   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    Social Determinants of Health   Financial Resource Strain: Not on file  Food Insecurity: Not on file  Transportation Needs: Not on file  Physical Activity: Not on file  Stress: Not on file  Social Connections: Not on file  Intimate Partner Violence: Not on file    FAMILY HISTORY: Family History  Problem Relation Age of Onset   Diabetes Mother    Prostate cancer Father    Breast cancer Sister    Heart attack Brother    Breast cancer Cousin 77    ALLERGIES:  is allergic to amoxicillin.  MEDICATIONS:  Current Outpatient Medications  Medication Sig Dispense Refill   anastrozole (ARIMIDEX) 1 MG tablet Take 1 tablet (1 mg total) by mouth daily. 30 tablet 4   atorvastatin (LIPITOR) 10 MG tablet Take 10 mg by mouth every evening.     bisoprolol-hydrochlorothiazide (ZIAC) 5-6.25 MG tablet Take 1 tablet by mouth every morning.     mesalamine (LIALDA) 1.2 g EC tablet TAKE 1 TABLET (1.2 G TOTAL) BY MOUTH DAILY WITH BREAKFAST FOR 120 DAYS TAKE ONE TABLET DAILY AS PRESCRIBED.     Probiotic Product (ALIGN) 4 MG CAPS Take 1 capsule by mouth daily.     No current facility-administered medications for this visit.    PHYSICAL EXAMINATION: ECOG PERFORMANCE STATUS: 0 - Asymptomatic  Vitals:   10/18/22 1400  BP: 116/76  Pulse: 73  Resp: 18  Temp: 98.9 F (37.2 C)   Filed Weights   10/18/22 1400  Weight: 177 lb 4.8 oz (80.4 kg)    Physical Exam Vitals and nursing note  reviewed.  HENT:     Head: Normocephalic and atraumatic.     Mouth/Throat:     Pharynx: Oropharynx is clear.  Eyes:     Extraocular Movements: Extraocular movements intact.     Pupils: Pupils are equal, round, and reactive to light.  Cardiovascular:     Rate and Rhythm: Normal rate and regular rhythm.  Pulmonary:     Comments: Decreased breath sounds bilaterally.  Abdominal:     Palpations: Abdomen is soft.  Musculoskeletal:        General: Normal range of motion.     Cervical back: Normal range of motion.  Skin:    General: Skin is warm.  Neurological:     General: No focal deficit present.     Mental Status: She is alert and oriented to person, place, and time.  Psychiatric:        Behavior: Behavior normal.        Judgment: Judgment normal.      LABORATORY DATA:  I have reviewed the data as listed Lab Results  Component Value Date   WBC 7.8 10/18/2022   HGB 14.4 10/18/2022   HCT 42.1 10/18/2022   MCV 89.8 10/18/2022   PLT 182 10/18/2022   Recent Labs    10/18/22 1402  NA 136  K 3.2*  CL 101  CO2 24  GLUCOSE 139*  BUN 12  CREATININE 0.87  CALCIUM 10.0  GFRNONAA >60  PROT 6.9  ALBUMIN 3.7  AST 21  ALT 18  ALKPHOS 117  BILITOT 0.5    RADIOGRAPHIC STUDIES: I have personally reviewed the radiological images as listed and agreed with the findings in the report. No results found.  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  Oncotype-recurrence score 15; which translates to about 4% recurrence in 10 years while on endocrine therapy. Patient s/p postlumpectomy radiation.   # Patient will be offered antihormone pill 1 a day for 5 years.  Discussed potential downsides including but not limited to arthralgias hot flashes and osteoporosis. Recommend calcium 1200 plus vitamin D-3  1000-over-the-counter-1 pill a day.     # Hypokalemia: K 3.2; recommend dietary suppl. [Hx of UC]  # Hx of UC- ? Flare up- monitor for now/defer to GI.   # Bone health: Discussed regarding bone health given her age and also given the need for aromatase inhibitor.  FEB, 2024- bone density test.  # Genetic counseling: # Genetics: Discussed with the patient majority of breast cancers are sporadic however 10 to 20% at risk of genetic/hereditary cancer syndromes.  Patient interested in genetic counseling.  Patient wants to wait.   # Vaccination: ok with COVID shot.   # DISPOSITION: # follow up 2 month- MD- no labs-  Dr.B  All questions were answered. The patient/family knows to call the clinic with any problems, questions or concerns.     Cammie Sickle, MD 10/18/2022 3:21 PM

## 2022-10-19 ENCOUNTER — Encounter: Payer: Self-pay | Admitting: *Deleted

## 2022-10-20 LAB — MISC LABCORP TEST (SEND OUT): Labcorp test code: 81950

## 2022-10-21 ENCOUNTER — Ambulatory Visit
Admission: RE | Admit: 2022-10-21 | Discharge: 2022-10-21 | Disposition: A | Discharge status: Home / Self Care | Payer: BC Managed Care – PPO | Source: Ambulatory Visit | Attending: Internal Medicine | Admitting: Internal Medicine

## 2022-10-21 DIAGNOSIS — Z170 Estrogen receptor positive status [ER+]: Secondary | ICD-10-CM | POA: Diagnosis present

## 2022-10-21 DIAGNOSIS — C50312 Malignant neoplasm of lower-inner quadrant of left female breast: Secondary | ICD-10-CM | POA: Insufficient documentation

## 2022-11-11 ENCOUNTER — Telehealth: Payer: Self-pay | Admitting: *Deleted

## 2022-11-11 NOTE — Telephone Encounter (Signed)
Patient called with multiple questions regarding her Anastrozole and side effects and what she can do to boost her energy etc. Please return her call [phone]

## 2022-11-12 ENCOUNTER — Encounter: Payer: Self-pay | Admitting: *Deleted

## 2022-11-12 NOTE — Progress Notes (Signed)
[name] called asking about some of the side effects from her anastrazole.   She is having fatigue and also felt like she might be having a flair of ulcerative colitis and wondered if the anastrazole could be causing that.   I spoke with Dr. B and he said it's not likely coming from anastrazole.   He suggested trying Vitamin D and if having joint aches and pains, try osteo bi-flex.   I also encouraged light exercise to help with fatigue.

## 2022-12-17 ENCOUNTER — Inpatient Hospital Stay: Payer: BC Managed Care – PPO | Attending: Internal Medicine | Admitting: Internal Medicine

## 2022-12-17 ENCOUNTER — Encounter: Payer: Self-pay | Admitting: Internal Medicine

## 2022-12-17 VITALS — BP 115/84 | HR 72 | Temp 97.9°F | Resp 16 | Wt 172.0 lb

## 2022-12-17 DIAGNOSIS — M8580 Other specified disorders of bone density and structure, unspecified site: Secondary | ICD-10-CM | POA: Insufficient documentation

## 2022-12-17 DIAGNOSIS — Z170 Estrogen receptor positive status [ER+]: Secondary | ICD-10-CM | POA: Insufficient documentation

## 2022-12-17 DIAGNOSIS — E876 Hypokalemia: Secondary | ICD-10-CM | POA: Insufficient documentation

## 2022-12-17 DIAGNOSIS — Z803 Family history of malignant neoplasm of breast: Secondary | ICD-10-CM | POA: Diagnosis not present

## 2022-12-17 DIAGNOSIS — Z923 Personal history of irradiation: Secondary | ICD-10-CM | POA: Diagnosis not present

## 2022-12-17 DIAGNOSIS — Z780 Asymptomatic menopausal state: Secondary | ICD-10-CM | POA: Diagnosis not present

## 2022-12-17 DIAGNOSIS — C50312 Malignant neoplasm of lower-inner quadrant of left female breast: Secondary | ICD-10-CM | POA: Diagnosis present

## 2022-12-17 DIAGNOSIS — Z79811 Long term (current) use of aromatase inhibitors: Secondary | ICD-10-CM | POA: Insufficient documentation

## 2022-12-17 NOTE — Progress Notes (Signed)
Survivorship Care Plan visit completed.  Treatment summary reviewed and given to patient.  ASCO answers booklet reviewed and given to patient.  CARE program and Cancer Transitions discussed with patient along with other resources cancer center offers to patients and caregivers.  Patient verbalized understanding.    

## 2022-12-17 NOTE — Assessment & Plan Note (Signed)
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  Oncotype-recurrence score 15; which translates to about 4% recurrence in 10 years while on endocrine therapy. Patient s/p postlumpectomy radiation.  Currently on anastrozole for the last 2 months. [Since FEB 2024].  # Question intolerance to anastrozole given the diarrhea / ?  Flareup of ulcerative colitis. Recommend holding anastrozole for 1 month.   # Hypokalemia: K 3.2; recommend dietary suppl. [Hx of UC]- stable.   # Hx of UC- on mesalamine- GI- Dr.Toledo- ?flare; awaiting evaluation with May, 2024.   # Osteopenia: FEB 2024- T-score of -1.1. FEB, 2024- bone density test- stable. Discussed the potential risk factors for osteoporosis- age/gender/postmenopausal status/use of anti-estrogen treatments. Discussed multiple options including exercise/ calcium and vitamin D supplementation/ and also use of bisphosphonates. Hold off for now. Will repeat BMD in 2 years.   # Genetic counseling: # Genetics: Discussed with the patient majority of breast cancers are sporadic however 10 to 20% at risk of genetic/hereditary cancer syndromes.  Patient interested in genetic counseling.  Patient wants to wait.   # DISPOSITION: # follow up 1 month- MD- lab- cbc/cmp; vit D25-OH-levels-  Dr.B

## 2022-12-17 NOTE — Progress Notes (Signed)
[facility] CONSULT NOTE  Patient Care Team: Carren Rang, PA-C as PCP - General (Physician Assistant) Hulen Luster, RN as Oncology Nurse Navigator Earna Coder, MD as Consulting Physician (Internal Medicine) Earna Coder, MD as Consulting Physician (Internal Medicine) Lemar Livings Merrily Pew, MD as Referring Physician (General Surgery)  CHIEF COMPLAINTS/PURPOSE OF CONSULTATION: Breast cancer  #  Oncology History Overview Note  FINDINGS: A spiculated left breast mass is identified in the lower inner left breast, best seen on the cc view.   Targeted ultrasound is performed, showing a spiculated irregular mass in the left breast at 8 o'clock, 6 cm from the nipple measuring 19 by 10 x 13 mm, correlating with the mammographic finding. No axillary adenopathy.   IMPRESSION: Highly suspicious spiculated left breast mass at 8 o'clock identified mammographically and sonographically.   RECOMMENDATION: Recommend ultrasound-guided biopsy of the left breast mass.   I have discussed the findings and recommendations with the patient. If applicable, a reminder letter will be sent to the patient regarding the next appointment.   BI-RADS CATEGORY  5: Highly suggestive of malignancy.  BREAST, LEFT, 8:00; CORE BIOPSIES:   - INVASIVE MAMMARY CARCINOMA, NO SPECIAL TYPE.   Size of invasive carcinoma: 11.5 mm in this sample  Histologic grade of invasive carcinoma: Grade 2 (of 3)                       Glandular/tubular differentiation score: 3 (of 3)                       Nuclear pleomorphism score: 2 (of 3)                       Mitotic rate score: 1 (of 3)                       Total score: 6 (of 9)  Ductal carcinoma in situ: Present  Lymphovascular invasion: Not identified   ER/PR/HER2: Immunohistochemistry will be performed on block A1, with  reflex to FISH for HER2 2+. The results will be reported in an addendum.   Comment:  The definitive grade will be  assigned on the excisional specimen.    B.  BREAST, LEFT, 9:00; BIOPSIES:   - INVASIVE MAMMARY CARCINOMA, NO SPECIAL TYPE.   Size of invasive carcinoma: 4.5 mm in this sample  Histologic grade of invasive carcinoma: Grade 1 (of 3)                       Glandular/tubular differentiation score: 2 (of 3)                       Nuclear pleomorphism score: 2 (of 3)                       Mitotic rate score: 1 (of 3)                       Total score: 5 (of 9)  Ductal carcinoma in situ: Not identified  Lymphovascular invasion: Not identified   # JULY-AUG 2023 - LEFT BREAST   # 8 o'clock-11 mm-ER/PR positive HER2 negative #9:00-4.5 mm ER/PR positive HER2 negative.   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  Alone.   [name] 62 y.o.  female history of stage I breast cancer ER/PR positive HER2 negative status postlumpectomy-low risk Oncotype currently s/p radiation-currently on anastrozole is here for follow-up.  Pt in for follow up, reports since starting Anastrozole she has had issues with ulcerative colitis. Pt believes it is from medication because she has not had any other diet or med changes   No nausea no vomiting.  No fatigue.   Review of Systems  Constitutional:  Negative for chills, diaphoresis, fever, malaise/fatigue and weight loss.  HENT:  Negative for nosebleeds and sore throat.   Eyes:  Negative for double vision.  Respiratory:   Negative for cough, hemoptysis, sputum production, shortness of breath and wheezing.   Cardiovascular:  Negative for chest pain, palpitations, orthopnea and leg swelling.  Gastrointestinal:  Negative for abdominal pain, blood in stool, constipation, diarrhea, heartburn, melena, nausea and vomiting.  Genitourinary:  Negative for dysuria, frequency and urgency.  Musculoskeletal:  Negative for back pain and joint pain.  Skin: Negative.  Negative for itching and rash.  Neurological:  Negative for dizziness, tingling, focal weakness, weakness and headaches.  Endo/Heme/Allergies:  Does not bruise/bleed easily.  Psychiatric/Behavioral:  Negative for depression. The patient is not nervous/anxious and does not have insomnia.      MEDICAL HISTORY:  Past Medical History:  Diagnosis Date   Breast cancer    left   GERD (gastroesophageal reflux disease)    History of hiatal hernia    HLD (hyperlipidemia)    Hypertension    Ulcerative colitis     SURGICAL HISTORY: Past Surgical History:  Procedure Laterality Date   ABDOMINAL HYSTERECTOMY     AXILLARY SENTINEL NODE BIOPSY Left 07/05/2022   Procedure: AXILLARY SENTINEL NODE BIOPSY;  Surgeon: Earline Mayotte, MD;  Location: [facility] ORS;  Service: General;  Laterality: Left;   BREAST BIOPSY Left 04/21/2022   [place] bx, path pending, ribbon marker   BREAST BIOPSY Left 04/21/2022   [place] bx, path pending, coil marker   BREAST BIOPSY Left 06/09/2022   Procedure: BREAST BIOPSY WITH NEEDLE LOCALIZATION;  Surgeon: Earline Mayotte, MD;  Location: [facility] ORS;  Service: General;  Laterality: Left;   BREAST LUMPECTOMY WITH NEEDLE LOCALIZATION Left 07/05/2022   Procedure: BREAST LUMPECTOMY WITH NEEDLE LOCALIZATION;  Surgeon: Earline Mayotte, MD;  Location: [facility] ORS;  Service: General;  Laterality: Left;   breast wire placement Left 07/05/2022   EYE MUSCLE SURGERY Left 09/14/1967   FRACTURE SURGERY Left 09/13/1972   arm   MANDIBLE SURGERY Bilateral 09/13/1977    UPPER GI ENDOSCOPY  09/13/2012   stretching of esophagus   WISDOM TOOTH EXTRACTION  09/14/1991   3 wisdom teeth    SOCIAL HISTORY: Social History   Socioeconomic History   Marital status: Single    Spouse name: Not on file   Number of children: Not on file   Years of education: Not on file   Highest education level: Not on file  Occupational History   Not on file  Tobacco Use   Smoking  status: Never   Smokeless tobacco: Not on file  Vaping Use   Vaping Use: Never used  Substance and Sexual Activity   Alcohol use: No   Drug use: No   Sexual activity: Not on file  Other Topics Concern   Not on file  Social History Narrative   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    Social Determinants of Health   Financial Resource Strain: Not on file  Food Insecurity: Not on file  Transportation Needs: Not on file  Physical Activity: Not on file  Stress: Not on file  Social Connections: Not on file  Intimate Partner Violence: Not on file    FAMILY HISTORY: Family History  Problem Relation Age of Onset   Diabetes Mother    Prostate cancer Father    Breast cancer Sister    Heart attack Brother    Breast cancer Cousin 255    ALLERGIES:  is allergic to amoxicillin.  MEDICATIONS:  Current Outpatient Medications  Medication Sig Dispense Refill   acetaminophen (TYLENOL) 325 MG tablet Take 650 mg by mouth every 6 (six) hours as needed.     anastrozole (ARIMIDEX) 1 MG tablet Take 1 tablet (1 mg total) by mouth daily. 30 tablet 4   atorvastatin (LIPITOR) 10 MG tablet Take 10 mg by mouth every evening.     bisoprolol-hydrochlorothiazide (ZIAC) 5-6.25 MG tablet Take 1 tablet by mouth every morning.     mesalamine (LIALDA) 1.2 g EC tablet TAKE 1 TABLET (1.2 G TOTAL) BY MOUTH DAILY WITH BREAKFAST FOR 120 DAYS TAKE ONE TABLET DAILY AS PRESCRIBED.     Probiotic Product (ALIGN) 4 MG CAPS Take 1 capsule by mouth daily.     No current  facility-administered medications for this visit.    PHYSICAL EXAMINATION: ECOG PERFORMANCE STATUS: 0 - Asymptomatic  Vitals:   12/17/22 1113  BP: 115/84  Pulse: 72  Resp: 16  Temp: 97.9 F (36.6 C)  SpO2: 100%   Filed Weights   12/17/22 1113  Weight: 172 lb (78 kg)    Physical Exam Vitals and nursing note reviewed.  HENT:     Head: Normocephalic and atraumatic.     Mouth/Throat:     Pharynx: Oropharynx is clear.  Eyes:     Extraocular Movements: Extraocular movements intact.     Pupils: Pupils are equal, round, and reactive to light.  Cardiovascular:     Rate and Rhythm: Normal rate and regular rhythm.  Pulmonary:     Comments: Decreased breath sounds bilaterally.  Abdominal:     Palpations: Abdomen is soft.  Musculoskeletal:        General: Normal range of motion.     Cervical back: Normal range of motion.  Skin:    General: Skin is warm.  Neurological:     General: No focal deficit present.     Mental Status: She is alert and oriented to person, place, and time.  Psychiatric:        Behavior: Behavior normal.        Judgment: Judgment normal.      LABORATORY DATA:  I have reviewed the data as listed Lab Results  Component Value Date   WBC 7.8 10/18/2022   HGB 14.4 10/18/2022   HCT 42.1 10/18/2022   MCV 89.8 10/18/2022   PLT 182 10/18/2022   Recent Labs    10/18/22 1402  NA 136  K 3.2*  CL 101  CO2 24  GLUCOSE 139*  BUN 12  CREATININE 0.87  CALCIUM 10.0  GFRNONAA >60  PROT 6.9  ALBUMIN 3.7  AST 21  ALT 18  ALKPHOS 117  BILITOT 0.5    RADIOGRAPHIC STUDIES: I have personally reviewed the radiological images as listed and agreed with the findings in the report. No results found.  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  Oncotype-recurrence score 15; which translates to  about 4% recurrence in 10 years while on endocrine therapy. Patient s/p postlumpectomy radiation.  Currently on anastrozole for the last 2 months. [Since FEB 2024].  # Question intolerance to anastrozole given the diarrhea / ?  Flareup of ulcerative colitis. Recommend holding anastrozole for 1 month.   # Hypokalemia: K 3.2; recommend dietary suppl. [Hx of UC]- stable.   # Hx of UC- on mesalamine- GI- Dr.Toledo- ?flare; awaiting evaluation with May, 2024.   # Osteopenia: FEB 2024- T-score of -1.1. FEB, 2024- bone density test- stable. Discussed the potential risk factors for osteoporosis- age/gender/postmenopausal status/use of anti-estrogen treatments. Discussed multiple options including exercise/ calcium and vitamin D supplementation/ and also use of bisphosphonates. Hold off for now. Will repeat BMD in 2 years.   # Genetic counseling: # Genetics: Discussed with the patient majority of breast cancers are sporadic however 10 to 20% at risk of genetic/hereditary cancer syndromes.  Patient interested in genetic counseling.  Patient wants to wait.   # DISPOSITION: # follow up 1 month- MD- lab- cbc/cmp; vit D25-OH-levels-  Dr.B  All questions were answered. The patient/family knows to call the clinic with any problems, questions or concerns.     Earna Coder, MD 12/17/2022 12:05 PM

## 2022-12-17 NOTE — Progress Notes (Signed)
Pt in for follow up, reports since starting Anastrozole she has had issues with ulcerative colitis.  Pt believes it is from medication because she has not had any other diet or med changes.

## 2022-12-17 NOTE — Patient Instructions (Addendum)
#  Recommend holding anastrazole for 1 month/next visit.   # Recommend calcium 1200 plus vitamin D-3 1000-over-the-counter-1 pill a day.

## 2023-01-18 ENCOUNTER — Ambulatory Visit: Payer: BC Managed Care – PPO | Admitting: Internal Medicine

## 2023-01-20 ENCOUNTER — Inpatient Hospital Stay: Payer: BC Managed Care – PPO | Attending: Internal Medicine | Admitting: Internal Medicine

## 2023-01-20 ENCOUNTER — Encounter: Payer: Self-pay | Admitting: Internal Medicine

## 2023-01-20 VITALS — BP 124/75 | HR 69 | Temp 98.7°F | Ht 64.0 in | Wt 169.4 lb

## 2023-01-20 DIAGNOSIS — Z170 Estrogen receptor positive status [ER+]: Secondary | ICD-10-CM | POA: Diagnosis not present

## 2023-01-20 DIAGNOSIS — C50312 Malignant neoplasm of lower-inner quadrant of left female breast: Secondary | ICD-10-CM | POA: Diagnosis not present

## 2023-01-20 MED ORDER — LETROZOLE 2.5 MG PO TABS: 2.5000 mg | ORAL_TABLET | Freq: Every day | ORAL | 4 refills | Status: DC

## 2023-01-20 NOTE — Progress Notes (Signed)
I connected with [name] on 01/20/23 at 10:15 AM EDT by video enabled telemedicine visit and verified that I am speaking with the correct person using two identifiers.  I discussed the limitations, risks, security and privacy concerns of performing an evaluation and management service by telemedicine and the availability of in-person appointments. I also discussed with the patient that there may be a patient responsible charge related to this service. The patient expressed understanding and agreed to proceed.    Other persons participating in the visit and their role in the encounter: RN/medical reconciliation Patient's location: office  Provider's location: home  Oncology History Overview Note  FINDINGS: A spiculated left breast mass is identified in the lower inner left breast, best seen on the cc view.   Targeted ultrasound is performed, showing a spiculated irregular mass in the left breast at 8 o'clock, 6 cm from the nipple measuring 19 by 10 x 13 mm, correlating with the mammographic finding. No axillary adenopathy.   IMPRESSION: Highly suspicious spiculated left breast mass at 8 o'clock identified mammographically and sonographically.   RECOMMENDATION: Recommend ultrasound-guided biopsy of the left breast mass.   I have discussed the findings and recommendations with the patient. If applicable, a reminder letter will be sent to the patient regarding the next appointment.   BI-RADS CATEGORY  5: Highly suggestive of malignancy.  BREAST, LEFT, 8:00; CORE BIOPSIES:   - INVASIVE MAMMARY CARCINOMA, NO SPECIAL TYPE.   Size of invasive carcinoma: 11.5 mm in this sample  Histologic grade of invasive carcinoma: Grade 2 (of 3)                       Glandular/tubular differentiation score: 3 (of 3)                       Nuclear pleomorphism score: 2 (of 3)                       Mitotic rate score: 1 (of 3)                       Total score: 6 (of 9)  Ductal carcinoma in  situ: Present  Lymphovascular invasion: Not identified   ER/PR/HER2: Immunohistochemistry will be performed on block A1, with  reflex to FISH for HER2 2+. The results will be reported in an addendum.   Comment:  The definitive grade will be assigned on the excisional specimen.    B.  BREAST, LEFT, 9:00; BIOPSIES:   - INVASIVE MAMMARY CARCINOMA, NO SPECIAL TYPE.   Size of invasive carcinoma: 4.5 mm in this sample  Histologic grade of invasive carcinoma: Grade 1 (of 3)                       Glandular/tubular differentiation score: 2 (of 3)                       Nuclear pleomorphism score: 2 (of 3)                       Mitotic rate score: 1 (of 3)                       Total score: 5 (of 9)  Ductal carcinoma in situ: Not identified  Lymphovascular invasion: Not identified   # JULY-AUG 2023 -  LEFT BREAST   # 8 o'clock-11 mm-ER/PR positive HER2 negative #9:00-4.5 mm ER/PR positive HER2 negative.   # [Since FEB 2024] Anastrazole- MARCH 2024- flareup of colitis- ? 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  female with history of ulcerative colitis and history of stage I ER/PR positive HER2 negative breast cancer currently on adjuvant therapy is here for follow-up.    Patient is currently off adjuvant anastrozole given possible flareup of colitis.  Since  stopping for 1 month patient noted to have improvement of her diarrhea abdominal discomfort symptoms.  Denies any nausea vomiting.  Denies any fevers or chills.  Mild fatigue.  Otherwise no chest pain or shortness of breath or cough.  Observation/objective: Alert & oriented x 3. In No acute distress.   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  Oncotype-recurrence score 15; which translates to about 4% recurrence in 10 years while on endocrine therapy. Patient s/p postlumpectomy radiation.  Currently on anastrozole for the last 2 months. [Since FEB 2024].  # Question intolerance to anastrozole given the diarrhea / ?  Flareup of ulcerative colitis. Recommend discontinuing anastrozole  given possible flareup of colitis.  START MAY 9th, 2024- start Letrozole.  Again reviewed the potential side effects including but not limited to hot flashes joint discomfort with letrozole.  Again monitor for any flareup of colitis on letrozole.  Patient also awaiting evaluation with GI this month.  Patient will call us back sooner if any worsening symptoms.  # Hypokalemia: K 3.2; recommend dietary suppl. [Hx of UC]- stable.   # vit D deficiency: FEB 2024- 29.4; vit D-25 in 3 months/ next visit     # Hx of UC- on mesalamine- GI- Dr.Toledo- ?flare; awaiting evaluation with May, 2024.   # Osteopenia: FEB 2024- T-score of -1.1. FEB, 2024- bone density test- stable. Discussed the potential risk factors for osteoporosis- age/gender/postmenopausal status/use of anti-estrogen treatments. Discussed multiple options including exercise/ calcium 1000 units a day.  Supplementation/ and also use of bisphosphonates. Hold off for now. Will repeat BMD in 2 years.   # Genetic counseling: # Genetics: Discussed with the patient majority of breast cancers are sporadic however 10  to 20% at risk of genetic/hereditary cancer syndromes.  Patient interested in genetic counseling.  Patient wants to wait.   # DISPOSITION: # follow up 3  month- MD- lab- cbc/cmp;vit D25-OH - Dr.B  Follow-up instructions:  I discussed the assessment and treatment plan with the patient.  The patient was provided an opportunity to ask questions and all were answered.  The patient agreed with the plan and demonstrated understanding of instructions.  The patient was advised to call back or seek an in person evaluation if the symptoms worsen or if the condition fails  to improve as anticipated.    Dr. Louretta Shorten [facility] 01/20/2023 10:43 AM

## 2023-01-20 NOTE — Assessment & Plan Note (Addendum)
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  Oncotype-recurrence score 15; which translates to about 4% recurrence in 10 years while on endocrine therapy. Patient s/p postlumpectomy radiation.  Currently on anastrozole for the last 2 months. [Since FEB 2024].  # Question intolerance to anastrozole given the diarrhea / ?  Flareup of ulcerative colitis. Recommend discontinuing anastrozole  given possible flareup of colitis.  START MAY 9th, 2024- start Letrozole.  Again reviewed the potential side effects including but not limited to hot flashes joint discomfort with letrozole.  Again monitor for any flareup of colitis on letrozole.  Patient also awaiting evaluation with GI this month.  Patient will call us back sooner if any worsening symptoms.  # Hypokalemia: K 3.2; recommend dietary suppl. [Hx of UC]- stable.   # vit D deficiency: FEB 2024- 29.4; vit D-25 in 3 months/ next visit     # Hx of UC- on mesalamine- GI- Dr.Toledo- ?flare; awaiting evaluation with May, 2024.   # Osteopenia: FEB 2024- T-score of -1.1. FEB, 2024- bone density test- stable. Discussed the potential risk factors for osteoporosis- age/gender/postmenopausal status/use of anti-estrogen treatments. Discussed multiple options including exercise/ calcium 1000 units a day.  Supplementation/ and also use of bisphosphonates. Hold off for now. Will repeat BMD in 2 years.   # Genetic counseling: # Genetics: Discussed with the patient majority of breast cancers are sporadic however 10 to 20% at risk of genetic/hereditary cancer syndromes.  Patient interested in genetic counseling.  Patient wants to wait.   # DISPOSITION: # follow up 3  month- MD- lab- cbc/cmp;vit D25-OH - Dr.B

## 2023-01-20 NOTE — Progress Notes (Signed)
C/o med options and fatigue.

## 2023-02-09 ENCOUNTER — Other Ambulatory Visit: Payer: Self-pay | Admitting: General Surgery

## 2023-02-09 DIAGNOSIS — Z853 Personal history of malignant neoplasm of breast: Secondary | ICD-10-CM

## 2023-03-04 ENCOUNTER — Emergency Department
Admission: EM | Admit: 2023-03-04 | Discharge: 2023-03-04 | Disposition: A | Discharge status: Home / Self Care | Payer: BC Managed Care – PPO | Attending: Student in an Organized Health Care Education/Training Program | Admitting: Student in an Organized Health Care Education/Training Program

## 2023-03-04 ENCOUNTER — Emergency Department: Payer: BC Managed Care – PPO

## 2023-03-04 ENCOUNTER — Encounter: Payer: Self-pay | Admitting: *Deleted

## 2023-03-04 ENCOUNTER — Other Ambulatory Visit: Payer: Self-pay

## 2023-03-04 DIAGNOSIS — J168 Pneumonia due to other specified infectious organisms: Secondary | ICD-10-CM | POA: Insufficient documentation

## 2023-03-04 DIAGNOSIS — R072 Precordial pain: Secondary | ICD-10-CM | POA: Diagnosis present

## 2023-03-04 DIAGNOSIS — Z1152 Encounter for screening for COVID-19: Secondary | ICD-10-CM | POA: Diagnosis not present

## 2023-03-04 DIAGNOSIS — Z853 Personal history of malignant neoplasm of breast: Secondary | ICD-10-CM | POA: Insufficient documentation

## 2023-03-04 DIAGNOSIS — R079 Chest pain, unspecified: Secondary | ICD-10-CM

## 2023-03-04 DIAGNOSIS — J189 Pneumonia, unspecified organism: Secondary | ICD-10-CM

## 2023-03-04 LAB — SARS CORONAVIRUS 2 BY RT PCR: SARS Coronavirus 2 by RT PCR: NEGATIVE

## 2023-03-04 LAB — CBC
HCT: 43.7 % (ref 36.0–46.0)
Hemoglobin: 14.6 g/dL (ref 12.0–15.0)
MCH: 30 pg (ref 26.0–34.0)
MCHC: 33.4 g/dL (ref 30.0–36.0)
MCV: 89.9 fL (ref 80.0–100.0)
Platelets: 185 10*3/uL (ref 150–400)
RBC: 4.86 MIL/uL (ref 3.87–5.11)
RDW: 12.3 % (ref 11.5–15.5)
WBC: 13.2 10*3/uL — ABNORMAL HIGH (ref 4.0–10.5)
nRBC: 0 % (ref 0.0–0.2)

## 2023-03-04 LAB — BASIC METABOLIC PANEL
Anion gap: 8 (ref 5–15)
BUN: 13 mg/dL (ref 6–20)
CO2: 22 mmol/L (ref 22–32)
Calcium: 9.7 mg/dL (ref 8.9–10.3)
Chloride: 102 mmol/L (ref 98–111)
Creatinine, Ser: 0.79 mg/dL (ref 0.44–1.00)
GFR, Estimated: >60 mL/min (ref >60)
Glucose, Bld: 119 mg/dL — ABNORMAL HIGH (ref 70–99)
Potassium: 3.3 mmol/L — ABNORMAL LOW (ref 3.5–5.1)
Sodium: 132 mmol/L — ABNORMAL LOW (ref 135–145)

## 2023-03-04 LAB — PROTIME-INR
INR: 1.1 (ref 0.8–1.2)
Prothrombin Time: 14.4 seconds (ref 11.4–15.2)

## 2023-03-04 LAB — TROPONIN I (HIGH SENSITIVITY)
Troponin I (High Sensitivity): 8 ng/L (ref <18)
Troponin I (High Sensitivity): 9 ng/L (ref <18)

## 2023-03-04 MED ORDER — IPRATROPIUM-ALBUTEROL 0.5-2.5 (3) MG/3ML IN SOLN
3.0000 mL | Freq: Once | RESPIRATORY_TRACT | Status: AC
  Administered 2023-03-04: 3 mL via RESPIRATORY_TRACT
  Filled 2023-03-04: qty 3

## 2023-03-04 MED ORDER — SODIUM CHLORIDE 0.9 % IV SOLN
500.0000 mg | INTRAVENOUS | Status: DC
  Administered 2023-03-04: 500 mg via INTRAVENOUS
  Filled 2023-03-04 (×2): qty 5

## 2023-03-04 MED ORDER — ALBUTEROL SULFATE HFA 108 (90 BASE) MCG/ACT IN AERS: 2.0000 | INHALATION_SPRAY | Freq: Four times a day (QID) | RESPIRATORY_TRACT | 2 refills | Status: DC | PRN

## 2023-03-04 MED ORDER — LEVOFLOXACIN 500 MG PO TABS: 500.0000 mg | ORAL_TABLET | Freq: Every day | ORAL | 0 refills | Status: AC

## 2023-03-04 MED ORDER — SODIUM CHLORIDE 0.9 % IV SOLN
2.0000 g | INTRAVENOUS | Status: DC
  Administered 2023-03-04: 2 g via INTRAVENOUS
  Filled 2023-03-04: qty 20

## 2023-03-04 MED ORDER — IOHEXOL 350 MG/ML SOLN
75.0000 mL | Freq: Once | INTRAVENOUS | Status: AC | PRN
  Administered 2023-03-04: 75 mL via INTRAVENOUS

## 2023-03-04 NOTE — ED Provider Notes (Signed)
[facility] Provider Note    Event Date/Time   First MD Initiated Contact with Patient 03/04/23 1128     (approximate)   History   Chest Pain   HPI  [name] is a 62 y.o. female with history of breast cancer s/p radiation on presents to the ER for evaluation of midsternal nonradiating pleuritic chest pain that started last night.  Does feel like she is having trouble catching her breath.  No history of COPD or bronchitis.  No history of heart disease.  No history of DVT or PE.  Did feel she had some chills last night took some Tylenol with improvement.  Denies any abdominal pain.  No nausea or vomiting.     Physical Exam   Triage Vital Signs: ED Triage Vitals  Enc Vitals Group     BP 03/04/23 1049 106/69     Pulse Rate 03/04/23 1049 84     Resp 03/04/23 1049 19     Temp 03/04/23 1049 98.3 F (36.8 C)     Temp src --      SpO2 03/04/23 1049 92 %     Weight 03/04/23 1052 168 lb (76.2 kg)     Height 03/04/23 1052 5\' 4"  (1.626 m)     Head Circumference --      Peak Flow --      Pain Score 03/04/23 1050 0     Pain Loc --      Pain Edu? --      Excl. in GC? --     Most recent vital signs: Vitals:   03/04/23 1049  BP: 106/69  Pulse: 84  Resp: 19  Temp: 98.3 F (36.8 C)  SpO2: 92%     Constitutional: Alert  Eyes: Conjunctivae are normal.  Head: Atraumatic. Nose: No congestion/rhinnorhea. Mouth/Throat: Mucous membranes are moist.   Neck: Painless ROM.  Cardiovascular:   Good peripheral circulation. Respiratory: Normal respiratory effort.  No retractions.  Gastrointestinal: Soft and nontender.  Musculoskeletal:  no deformity Neurologic:  MAE spontaneously. No gross focal neurologic deficits are appreciated.  Skin:  Skin is warm, dry and intact. No rash noted. Psychiatric: Mood and affect are normal. Speech and behavior are normal.    ED Results / Procedures / Treatments   Labs (all labs ordered are listed, but only abnormal  results are displayed) Labs Reviewed  CBC - Abnormal; Notable for the following components:      Result Value   WBC 13.2 (*)    All other components within normal limits  BASIC METABOLIC PANEL - Abnormal; Notable for the following components:   Sodium 132 (*)    Potassium 3.3 (*)    Glucose, Bld 119 (*)    All other components within normal limits  SARS CORONAVIRUS 2 BY RT PCR  CULTURE, BLOOD (ROUTINE X 2)  CULTURE, BLOOD (ROUTINE X 2)  PROTIME-INR  TROPONIN I (HIGH SENSITIVITY)  TROPONIN I (HIGH SENSITIVITY)     EKG  ED ECG REPORT I, Willy Eddy, the attending physician, personally viewed and interpreted this ECG.   Date: 03/04/2023  EKG Time: 10:43  Rate: 85  Rhythm: sinus  Axis: normal  Intervals: normal  ST&T Change: no stemi, no depressions    RADIOLOGY Please see ED Course for my review and interpretation.  I personally reviewed all radiographic images ordered to evaluate for the above acute complaints and reviewed radiology reports and findings.  These findings were personally discussed with the patient.  Please see medical record for radiology report.    PROCEDURES:  Critical Care performed:   Procedures   MEDICATIONS ORDERED IN ED: Medications  cefTRIAXone (ROCEPHIN) 2 g in sodium chloride 0.9 % 100 mL IVPB (has no administration in time range)  azithromycin (ZITHROMAX) 500 mg in sodium chloride 0.9 % 250 mL IVPB (has no administration in time range)  ipratropium-albuterol (DUONEB) 0.5-2.5 (3) MG/3ML nebulizer solution 3 mL (3 mLs Nebulization Given 03/04/23 1336)  iohexol (OMNIPAQUE) 350 MG/ML injection 75 mL (75 mLs Intravenous Contrast Given 03/04/23 1318)     IMPRESSION / MDM / ASSESSMENT AND PLAN / ED COURSE  I reviewed the triage vital signs and the nursing notes.                              Differential diagnosis includes, but is not limited to, ACS, pericarditis, esophagitis, boerhaaves, pe, dissection, pna, bronchitis,  costochondritis  Patient presenting to the ER for evaluation of symptoms as described above.  Based on symptoms, risk factors and considered above differential, this presenting complaint could reflect a potentially life-threatening illness therefore the patient will be placed on continuous pulse oximetry and telemetry for monitoring.  Laboratory evaluation will be sent to evaluate for the above complaints.      Clinical Course as of 03/04/23 1513  Fri Mar 04, 2023  1128 Chest x-ray my review and interpretation without evidence of consolidation or pneumothorax. [PR]  1351 CTA without evidence of PE [PR]  1415 Patient is nontoxic-appearing on reassessment.  Do suspect her pain discomfort is secondary to pneumonia.  Not consistent with ACS.  Will check ambulation trial.  If not hypoxic not tachycardic I think that outpatient follow-up would be reasonable as she is well-established. [PR]  1512 Patient ambulated with steady gait no hypoxia she remains well-appearing no acute distress.  Discussed option for observation hospital IV antibiotics and admission versus outpatient follow-up and patient agreeable with plan for outpatient follow-up.  Agrees to return if her symptoms worsen. [PR]    Clinical Course User Index [PR] Willy Eddy, MD     FINAL CLINICAL IMPRESSION(S) / ED DIAGNOSES   Final diagnoses:  Chest pain, unspecified type  Pneumonia of left lung due to infectious organism, unspecified part of lung     Rx / DC Orders   ED Discharge Orders          Ordered    levofloxacin (LEVAQUIN) 500 MG tablet  Daily        03/04/23 1511    albuterol (VENTOLIN HFA) 108 (90 Base) MCG/ACT inhaler  Every 6 hours PRN        03/04/23 1512             Note:  This document was prepared using Dragon voice recognition software and may include unintentional dictation errors.    Willy Eddy, MD 03/04/23 [phone]

## 2023-03-04 NOTE — ED Notes (Signed)
Ambulated Pt around unit without complaint of dizziness. O2 saturation on RA was 92-98%. MD notified

## 2023-03-04 NOTE — ED Triage Notes (Signed)
Patient c/o chest pain since last night. Patient had some relief with Tylenol. Patient c/o 4/10 mid-sternal chest pain that is described as pressure. Patient states she was diaphoretic  during the night. Patient states "I can't catch a deep breath." Patient denies nausea. Patient states she is currently being treated for breast cancer.

## 2023-03-04 NOTE — Discharge Instructions (Addendum)
  IMPRESSION: No pulmonary embolism identified.   Lower lobe consolidative lung opacities as well as areas in the lingula and middle lobe. Acute infiltrates possible including infection. Recommend follow-up to confirm clearance.   Aggressive appearing left axillary lymph node. Please correlate for history of breast cancer. This could be a malignant node. Recommend further workup and correlation any known history.

## 2023-03-09 ENCOUNTER — Encounter: Payer: Self-pay | Admitting: Radiation Oncology

## 2023-03-09 ENCOUNTER — Ambulatory Visit
Admission: RE | Admit: 2023-03-09 | Discharge: 2023-03-09 | Disposition: A | Discharge status: Home / Self Care | Payer: BC Managed Care – PPO | Source: Ambulatory Visit | Attending: Radiation Oncology | Admitting: Radiation Oncology

## 2023-03-09 ENCOUNTER — Other Ambulatory Visit: Payer: Self-pay | Admitting: *Deleted

## 2023-03-09 ENCOUNTER — Other Ambulatory Visit: Payer: Self-pay | Admitting: General Surgery

## 2023-03-09 VITALS — BP 112/76 | HR 74 | Temp 99.1°F | Resp 16 | Ht 64.0 in | Wt 168.0 lb

## 2023-03-09 DIAGNOSIS — R599 Enlarged lymph nodes, unspecified: Secondary | ICD-10-CM

## 2023-03-09 DIAGNOSIS — C50312 Malignant neoplasm of lower-inner quadrant of left female breast: Secondary | ICD-10-CM | POA: Insufficient documentation

## 2023-03-09 DIAGNOSIS — Z79811 Long term (current) use of aromatase inhibitors: Secondary | ICD-10-CM | POA: Diagnosis not present

## 2023-03-09 DIAGNOSIS — R918 Other nonspecific abnormal finding of lung field: Secondary | ICD-10-CM | POA: Insufficient documentation

## 2023-03-09 DIAGNOSIS — Z170 Estrogen receptor positive status [ER+]: Secondary | ICD-10-CM | POA: Insufficient documentation

## 2023-03-09 DIAGNOSIS — Z923 Personal history of irradiation: Secondary | ICD-10-CM | POA: Insufficient documentation

## 2023-03-09 DIAGNOSIS — Z853 Personal history of malignant neoplasm of breast: Secondary | ICD-10-CM

## 2023-03-09 NOTE — Progress Notes (Signed)
Radiation Oncology Follow up Note  Name: [name]   Date:   03/09/2023 MRN:  [id number] DOB: May 27, 1961    This 62 y.o. female presents to the clinic today for 2-month follow-up status post whole breast radiation to her left breast for stage Ia ER/PR positive invasive mammary carcinoma.  REFERRING PROVIDER: Carren Rang, PA-C  HPI: Patient is a 62 year old female now out 7 months having completed whole breast radiation to her left breast for stage Ia ER/PR positive invasive mammary carcinoma.  She did have multifocal disease I believe at the time of diagnosis.  She has done well specifically denies breast tenderness cough or bone pain..  She recently had chest pain which prompted a CT scan of her chest showing possible pneumonia with lower lobe consolidative lung opacities as well as areas in the lingula and middle lobe.  There is also an aggressive appearing left axillary lymph node.  She is currently on Femara tolerating it well without side effect  COMPLICATIONS OF TREATMENT: none  FOLLOW UP COMPLIANCE: keeps appointments   PHYSICAL EXAM:  BP 112/76   Pulse 74   Temp 99.1 F (37.3 C)   Resp 16   Ht 5\' 4"  (1.626 m)   Wt 168 lb (76.2 kg)   LMP 11/01/2015   BMI 28.84 kg/m  Lungs are clear to A&P cardiac examination essentially unremarkable with regular rate and rhythm. No dominant mass or nodularity is noted in either breast in 2 positions examined. Incision is well-healed. No axillary or supraclavicular adenopathy is appreciated. Cosmetic result is excellent.  Well-developed well-nourished patient in NAD. HEENT reveals PERLA, EOMI, discs not visualized.  Oral cavity is clear. No oral mucosal lesions are identified. Neck is clear without evidence of cervical or supraclavicular adenopathy. Lungs are clear to A&P. Cardiac examination is essentially unremarkable with regular rate and rhythm without murmur rub or thrill. Abdomen is benign with no organomegaly or masses noted. Motor  sensory and DTR levels are equal and symmetric in the upper and lower extremities. Cranial nerves II through XII are grossly intact. Proprioception is intact. No peripheral adenopathy or edema is identified. No motor or sensory levels are noted. Crude visual fields are within normal range.  RADIOLOGY RESULTS: CT scan reviewed compatible with above-stated findings  PLAN: At this time I am scheduling a left axillary ultrasound to determine whether this is a node that may need biopsy.  Otherwise she is doing well.  May move up her appointment in August with Dr. B.  Otherwise I will see her back in 6 months for follow-up.  I would like to take this opportunity to thank you for allowing me to participate in the care of your patient.Jamie Miller, MD

## 2023-03-11 ENCOUNTER — Ambulatory Visit
Admission: RE | Admit: 2023-03-11 | Payer: BC Managed Care – PPO | Source: Home / Self Care | Admitting: Internal Medicine

## 2023-03-11 ENCOUNTER — Encounter: Admission: RE | Payer: Self-pay | Source: Home / Self Care

## 2023-03-11 SURGERY — COLONOSCOPY WITH PROPOFOL
Anesthesia: General

## 2023-03-18 ENCOUNTER — Ambulatory Visit
Admission: RE | Admit: 2023-03-18 | Discharge: 2023-03-18 | Disposition: A | Discharge status: Home / Self Care | Payer: BC Managed Care – PPO | Source: Ambulatory Visit | Attending: General Surgery | Admitting: General Surgery

## 2023-03-18 ENCOUNTER — Other Ambulatory Visit: Payer: Self-pay | Admitting: General Surgery

## 2023-03-18 DIAGNOSIS — Z853 Personal history of malignant neoplasm of breast: Secondary | ICD-10-CM | POA: Insufficient documentation

## 2023-03-18 DIAGNOSIS — R599 Enlarged lymph nodes, unspecified: Secondary | ICD-10-CM

## 2023-03-18 HISTORY — DX: Personal history of irradiation: Z92.3

## 2023-03-21 ENCOUNTER — Other Ambulatory Visit: Payer: Self-pay | Admitting: General Surgery

## 2023-03-21 DIAGNOSIS — R2232 Localized swelling, mass and lump, left upper limb: Secondary | ICD-10-CM

## 2023-03-21 DIAGNOSIS — R928 Other abnormal and inconclusive findings on diagnostic imaging of breast: Secondary | ICD-10-CM

## 2023-03-28 ENCOUNTER — Ambulatory Visit
Admission: RE | Admit: 2023-03-28 | Discharge: 2023-03-28 | Disposition: A | Discharge status: Home / Self Care | Payer: BC Managed Care – PPO | Source: Ambulatory Visit | Attending: General Surgery | Admitting: General Surgery

## 2023-03-28 ENCOUNTER — Other Ambulatory Visit: Payer: Self-pay | Admitting: General Surgery

## 2023-03-28 DIAGNOSIS — R2232 Localized swelling, mass and lump, left upper limb: Secondary | ICD-10-CM

## 2023-03-28 DIAGNOSIS — R222 Localized swelling, mass and lump, trunk: Secondary | ICD-10-CM | POA: Insufficient documentation

## 2023-03-28 DIAGNOSIS — R928 Other abnormal and inconclusive findings on diagnostic imaging of breast: Secondary | ICD-10-CM

## 2023-03-28 DIAGNOSIS — Z923 Personal history of irradiation: Secondary | ICD-10-CM | POA: Insufficient documentation

## 2023-03-28 MED ORDER — LIDOCAINE-EPINEPHRINE 1 %-1:100000 IJ SOLN
5.0000 mL | Freq: Once | INTRAMUSCULAR | Status: AC
  Administered 2023-03-28: 5 mL
  Filled 2023-03-28: qty 5

## 2023-03-28 MED ORDER — LIDOCAINE HCL 1 % IJ SOLN
5.0000 mL | Freq: Once | INTRAMUSCULAR | Status: AC
  Administered 2023-03-28: 5 mL
  Filled 2023-03-28: qty 5

## 2023-04-01 ENCOUNTER — Other Ambulatory Visit: Payer: BC Managed Care – PPO

## 2023-04-14 ENCOUNTER — Other Ambulatory Visit: Payer: BC Managed Care – PPO

## 2023-04-22 ENCOUNTER — Inpatient Hospital Stay: Payer: BC Managed Care – PPO | Attending: Internal Medicine

## 2023-04-22 ENCOUNTER — Inpatient Hospital Stay (HOSPITAL_BASED_OUTPATIENT_CLINIC_OR_DEPARTMENT_OTHER): Payer: BC Managed Care – PPO | Admitting: Internal Medicine

## 2023-04-22 ENCOUNTER — Encounter: Payer: Self-pay | Admitting: Internal Medicine

## 2023-04-22 VITALS — BP 118/74 | HR 69 | Temp 98.2°F | Ht 64.0 in | Wt 167.0 lb

## 2023-04-22 DIAGNOSIS — E559 Vitamin D deficiency, unspecified: Secondary | ICD-10-CM | POA: Insufficient documentation

## 2023-04-22 DIAGNOSIS — Z170 Estrogen receptor positive status [ER+]: Secondary | ICD-10-CM | POA: Insufficient documentation

## 2023-04-22 DIAGNOSIS — Z79811 Long term (current) use of aromatase inhibitors: Secondary | ICD-10-CM | POA: Insufficient documentation

## 2023-04-22 DIAGNOSIS — K5190 Ulcerative colitis, unspecified, without complications: Secondary | ICD-10-CM | POA: Insufficient documentation

## 2023-04-22 DIAGNOSIS — M8580 Other specified disorders of bone density and structure, unspecified site: Secondary | ICD-10-CM | POA: Insufficient documentation

## 2023-04-22 DIAGNOSIS — C50312 Malignant neoplasm of lower-inner quadrant of left female breast: Secondary | ICD-10-CM | POA: Diagnosis present

## 2023-04-22 DIAGNOSIS — Z803 Family history of malignant neoplasm of breast: Secondary | ICD-10-CM | POA: Insufficient documentation

## 2023-04-22 LAB — CBC WITH DIFFERENTIAL (CANCER CENTER ONLY)
Abs Immature Granulocytes: 0.03 10*3/uL (ref 0.00–0.07)
Basophils Absolute: 0.1 10*3/uL (ref 0.0–0.1)
Basophils Relative: 1 %
Eosinophils Absolute: 0.3 10*3/uL (ref 0.0–0.5)
Eosinophils Relative: 3 %
HCT: 43 % (ref 36.0–46.0)
Hemoglobin: 14.2 g/dL (ref 12.0–15.0)
Immature Granulocytes: 0 %
Lymphocytes Relative: 13 %
Lymphs Abs: 1 10*3/uL (ref 0.7–4.0)
MCH: 30.1 pg (ref 26.0–34.0)
MCHC: 33 g/dL (ref 30.0–36.0)
MCV: 91.1 fL (ref 80.0–100.0)
Monocytes Absolute: 0.6 10*3/uL (ref 0.1–1.0)
Monocytes Relative: 7 %
Neutro Abs: 5.8 10*3/uL (ref 1.7–7.7)
Neutrophils Relative %: 76 %
Platelet Count: 182 10*3/uL (ref 150–400)
RBC: 4.72 MIL/uL (ref 3.87–5.11)
RDW: 13.1 % (ref 11.5–15.5)
WBC Count: 7.8 10*3/uL (ref 4.0–10.5)
nRBC: 0 % (ref 0.0–0.2)

## 2023-04-22 LAB — CMP (CANCER CENTER ONLY)
ALT: 18 U/L (ref 0–44)
AST: 18 U/L (ref 15–41)
Albumin: 3.8 g/dL (ref 3.5–5.0)
Alkaline Phosphatase: 97 U/L (ref 38–126)
Anion gap: 6 (ref 5–15)
BUN: 14 mg/dL (ref 6–20)
CO2: 27 mmol/L (ref 22–32)
Calcium: 9.9 mg/dL (ref 8.9–10.3)
Chloride: 104 mmol/L (ref 98–111)
Creatinine: 0.98 mg/dL (ref 0.44–1.00)
GFR, Estimated: >60 mL/min (ref >60)
Glucose, Bld: 111 mg/dL — ABNORMAL HIGH (ref 70–99)
Potassium: 3.7 mmol/L (ref 3.5–5.1)
Sodium: 137 mmol/L (ref 135–145)
Total Bilirubin: 0.8 mg/dL (ref 0.3–1.2)
Total Protein: 6.9 g/dL (ref 6.5–8.1)

## 2023-04-22 LAB — VITAMIN D 25 HYDROXY (VIT D DEFICIENCY, FRACTURES): Vit D, 25-Hydroxy: 64.23 ng/mL (ref 30–100)

## 2023-04-22 NOTE — Progress Notes (Signed)
No concerns today 

## 2023-04-22 NOTE — Progress Notes (Signed)
[facility] CONSULT NOTE  Patient Care Team: Carren Rang, PA-C as PCP - General (Physician Assistant) Hulen Luster, RN as Oncology Nurse Navigator Earna Coder, MD as Consulting Physician (Internal Medicine) Earna Coder, MD as Consulting Physician (Internal Medicine) Lemar Livings Merrily Pew, MD as Referring Physician (General Surgery)  CHIEF COMPLAINTS/PURPOSE OF CONSULTATION: Breast cancer  #  Oncology History Overview Note  FINDINGS: A spiculated left breast mass is identified in the lower inner left breast, best seen on the cc view.   Targeted ultrasound is performed, showing a spiculated irregular mass in the left breast at 8 o'clock, 6 cm from the nipple measuring 19 by 10 x 13 mm, correlating with the mammographic finding. No axillary adenopathy.   IMPRESSION: Highly suspicious spiculated left breast mass at 8 o'clock identified mammographically and sonographically.   RECOMMENDATION: Recommend ultrasound-guided biopsy of the left breast mass.   I have discussed the findings and recommendations with the patient. If applicable, a reminder letter will be sent to the patient regarding the next appointment.   BI-RADS CATEGORY  5: Highly suggestive of malignancy.  BREAST, LEFT, 8:00; CORE BIOPSIES:   - INVASIVE MAMMARY CARCINOMA, NO SPECIAL TYPE.   Size of invasive carcinoma: 11.5 mm in this sample  Histologic grade of invasive carcinoma: Grade 2 (of 3)                       Glandular/tubular differentiation score: 3 (of 3)                       Nuclear pleomorphism score: 2 (of 3)                       Mitotic rate score: 1 (of 3)                       Total score: 6 (of 9)  Ductal carcinoma in situ: Present  Lymphovascular invasion: Not identified   ER/PR/HER2: Immunohistochemistry will be performed on block A1, with  reflex to FISH for HER2 2+. The results will be reported in an addendum.   Comment:  The definitive grade will be  assigned on the excisional specimen.    B.  BREAST, LEFT, 9:00; BIOPSIES:   - INVASIVE MAMMARY CARCINOMA, NO SPECIAL TYPE.   Size of invasive carcinoma: 4.5 mm in this sample  Histologic grade of invasive carcinoma: Grade 1 (of 3)                       Glandular/tubular differentiation score: 2 (of 3)                       Nuclear pleomorphism score: 2 (of 3)                       Mitotic rate score: 1 (of 3)                       Total score: 5 (of 9)  Ductal carcinoma in situ: Not identified  Lymphovascular invasion: Not identified   # JULY-AUG 2023 - LEFT BREAST   # 8 o'clock-11 mm-ER/PR positive HER2 negative #9:00-4.5 mm ER/PR positive HER2 negative.   # [Since FEB 2024] Anastrazole- MARCH 2024- flareup of colitis- ? 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  Alone.   [name] 62 y.o.  female history of stage I breast cancer ER/PR positive HER2 negative status postlumpectomy-low risk Oncotype currently s/p radiation is here for follow-up. Patient currently on  letrozole.   Denies any significant side effects. Denies any diarrhea.  No nausea no vomiting.  No fatigue.  Review of Systems  Constitutional:  Negative for chills, diaphoresis, fever, malaise/fatigue and weight loss.  HENT:  Negative for nosebleeds and sore throat.   Eyes:  Negative for double vision.   Respiratory:  Negative for cough, hemoptysis, sputum production, shortness of breath and wheezing.   Cardiovascular:  Negative for chest pain, palpitations, orthopnea and leg swelling.  Gastrointestinal:  Negative for abdominal pain, blood in stool, constipation, diarrhea, heartburn, melena, nausea and vomiting.  Genitourinary:  Negative for dysuria, frequency and urgency.  Musculoskeletal:  Negative for back pain and joint pain.  Skin: Negative.  Negative for itching and rash.  Neurological:  Negative for dizziness, tingling, focal weakness, weakness and headaches.  Endo/Heme/Allergies:  Does not bruise/bleed easily.  Psychiatric/Behavioral:  Negative for depression. The patient is not nervous/anxious and does not have insomnia.      MEDICAL HISTORY:  Past Medical History:  Diagnosis Date   Breast cancer (HCC)    left   GERD (gastroesophageal reflux disease)    History of hiatal hernia    HLD (hyperlipidemia)    Hypertension    Personal history of radiation therapy    Ulcerative colitis (HCC)     SURGICAL HISTORY: Past Surgical History:  Procedure Laterality Date   ABDOMINAL HYSTERECTOMY     AXILLARY SENTINEL NODE BIOPSY Left 07/05/2022   Procedure: AXILLARY SENTINEL NODE BIOPSY;  Surgeon: Earline Mayotte, MD;  Location: [facility] ORS;  Service: General;  Laterality: Left;   BREAST BIOPSY Left 04/21/2022   [place] bx, path pending, ribbon marker   BREAST BIOPSY Left 04/21/2022   [place] bx, path pending, coil marker   BREAST BIOPSY Left 06/09/2022   Procedure: BREAST BIOPSY WITH NEEDLE LOCALIZATION;  Surgeon: Earline Mayotte, MD;  Location: [facility] ORS;  Service: General;  Laterality: Left;   BREAST LUMPECTOMY Left 07/05/2022   BREAST LUMPECTOMY WITH NEEDLE LOCALIZATION Left 07/05/2022   Procedure: BREAST LUMPECTOMY WITH NEEDLE LOCALIZATION;  Surgeon: Earline Mayotte, MD;  Location: [facility] ORS;  Service: General;  Laterality: Left;   breast wire placement Left 07/05/2022   EYE MUSCLE  SURGERY Left 09/14/1967   FRACTURE SURGERY Left 09/13/1972   arm   MANDIBLE SURGERY Bilateral 09/13/1977   UPPER GI ENDOSCOPY  09/13/2012   stretching of esophagus   WISDOM TOOTH EXTRACTION  09/14/1991   3 wisdom teeth    SOCIAL HISTORY: Social History   Socioeconomic History   Marital status: Single    Spouse name: Not on file   Number of children: Not on file   Years of education: Not on file   Highest education level: Not on file  Occupational History   Not on file  Tobacco Use   Smoking status: Never   Smokeless tobacco: Not on file  Vaping Use   Vaping status: Never Used  Substance and Sexual Activity   Alcohol use: No   Drug use: No   Sexual activity: Not on file  Other Topics Concern   Not on file  Social History Narrative   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    Social Determinants of Health   Financial Resource Strain: High Risk (03/09/2023)   Received from [facility], [facility]   Overall Financial Resource Strain (CARDIA)    Difficulty of Paying Living Expenses: Hard  Food Insecurity: No Food Insecurity (03/09/2023)   Received from [facility], [facility]   Hunger Vital Sign    Worried About Running Out of Food in the Last Year: Never true    Ran Out of Food in the Last Year: Never true  Transportation Needs: No Transportation Needs (03/09/2023)   Received from [facility], [facility]   [facility] - Transportation    In the past 12 months, has lack of transportation kept you from medical appointments or from getting medications?: No    Lack of Transportation (Non-Medical): No  Physical Activity: Not on file  Stress: Not on file  Social Connections: Not on file  Intimate Partner Violence: Not on file    FAMILY HISTORY: Family History  Problem Relation Age of Onset   Diabetes Mother    Prostate cancer  Father    Breast cancer Sister    Heart attack Brother    Breast cancer Cousin 84    ALLERGIES:  is allergic to amoxicillin.  MEDICATIONS:  Current Outpatient Medications  Medication Sig Dispense Refill   acetaminophen (TYLENOL) 325 MG tablet Take 650 mg by mouth every 6 (six) hours as needed.     albuterol (VENTOLIN HFA) 108 (90 Base) MCG/ACT inhaler Inhale 2 puffs into the lungs every 6 (six) hours as needed for wheezing or shortness of breath. 8 g 2   atorvastatin (LIPITOR) 10 MG tablet Take 10 mg by mouth every evening.     bisoprolol-hydrochlorothiazide (ZIAC) 5-6.25 MG tablet Take 1 tablet by mouth every morning.     Cholecalciferol (VITAMIN D3) 250 MCG (10000 UT) capsule Take 10,000 Units by mouth daily.     letrozole (FEMARA) 2.5 MG tablet Take 1 tablet (2.5 mg total) by mouth daily. 30 tablet 4   mesalamine (LIALDA) 1.2 g EC tablet TAKE 1 TABLET (1.2 G TOTAL) BY MOUTH DAILY WITH BREAKFAST FOR 120 DAYS TAKE ONE TABLET DAILY AS PRESCRIBED.     Probiotic Product (ALIGN) 4 MG CAPS Take 1 capsule by mouth daily.     No current facility-administered medications for this visit.    PHYSICAL EXAMINATION: ECOG PERFORMANCE STATUS: 0 - Asymptomatic  Vitals:   04/22/23 0941  BP: 118/74  Pulse: 69  Temp: 98.2 F (36.8 C)  SpO2: 97%    Filed Weights   04/22/23 0941  Weight: 167 lb (75.8 kg)     Physical Exam Vitals and nursing note reviewed.  HENT:     Head: Normocephalic and atraumatic.     Mouth/Throat:     Pharynx: Oropharynx is clear.  Eyes:     Extraocular Movements: Extraocular movements intact.     Pupils: Pupils are equal, round, and reactive to light.  Cardiovascular:  Rate and Rhythm: Normal rate and regular rhythm.  Pulmonary:     Comments: Decreased breath sounds bilaterally.  Abdominal:     Palpations: Abdomen is soft.  Musculoskeletal:        General: Normal range of motion.     Cervical back: Normal range of motion.  Skin:    General: Skin is  warm.  Neurological:     General: No focal deficit present.     Mental Status: She is alert and oriented to person, place, and time.  Psychiatric:        Behavior: Behavior normal.        Judgment: Judgment normal.      LABORATORY DATA:  I have reviewed the data as listed Lab Results  Component Value Date   WBC 7.8 04/22/2023   HGB 14.2 04/22/2023   HCT 43.0 04/22/2023   MCV 91.1 04/22/2023   PLT 182 04/22/2023   Recent Labs    10/18/22 1402 03/04/23 1230 04/22/23 0950  NA 136 132* 137  K 3.2* 3.3* 3.7  CL 101 102 104  CO2 24 22 27   GLUCOSE 139* 119* 111*  BUN 12 13 14   CREATININE 0.87 0.79 0.98  CALCIUM 10.0 9.7 9.9  GFRNONAA >60 >60 >60  PROT 6.9  --  6.9  ALBUMIN 3.7  --  3.8  AST 21  --  18  ALT 18  --  18  ALKPHOS 117  --  97  BILITOT 0.5  --  0.8    RADIOGRAPHIC STUDIES: I have personally reviewed the radiological images as listed and agreed with the findings in the report. [place] AXILLARY NODE CORE BIOPSY LEFT  Addendum Date: 03/29/2023   ADDENDUM REPORT: 03/29/2023 12:45 ADDENDUM: PATHOLOGY revealed: Lymph node, needle/core biopsy, left axillary mass, hydromark - ORGANIZING FAT NECROSIS AND CHANGES CONSISTENT WITH PRIOR SURGICAL SITE.- NEGATIVE FOR ATYPIA AND MALIGNANCY. Pathology results are CONCORDANT with imaging findings, per Dr. Meda Klinefelter. Pathology results and recommendations were discussed with patient via telephone on 03/29/2023. Patient reported biopsy site doing well with no adverse symptoms, and only slight tenderness at the site. Post biopsy care instructions were reviewed, questions were answered and my direct phone number was provided. Patient was instructed to call [facility] for any additional questions or concerns related to biopsy site. RECOMMENDATION: Patient to return in one year for bilateral diagnostic mammogram (with right and left breast ultrasound if needed) to follow post lumpectomy protocol. Pathology results reported by  Randa Lynn RN on 03/29/2023. Electronically Signed   By: Meda Klinefelter M.D.   On: 03/29/2023 12:45   Result Date: 03/29/2023 CLINICAL DATA:  Patient is status post LEFT lumpectomy and axillary node dissection with 3 negative sentinel lymph nodes sampled in October 2023. Low-density mass was noted in the LEFT axilla on recent chest CT. This is deep to the scar site from nodal dissection. Patient presents for aspiration with potential conversion to biopsy. EXAM: [place] AXILLARY NODE CORE BIOPSY LEFT COMPARISON:  Previous exam(s). PROCEDURE: I met with the patient and we discussed the procedure of ultrasound-guided biopsy, including benefits and alternatives. We discussed the high likelihood of a successful procedure. We discussed the risks of the procedure, including infection, bleeding, tissue injury, clip migration, and inadequate sampling. Informed written consent was given. The usual time-out protocol was performed immediately prior to the procedure. Using sterile technique and 1% lidocaine and 1% lidocaine with epinephrine as local anesthetic, attempt at ultrasound-guided aspiration of a LEFT axillary mass was performed.  Area expanded with injection of lidocaine but would not fully collapse. As such, procedure was converted to a biopsy. Using sterile technique and 1% lidocaine and 1% lidocaine with epinephrine as local anesthetic, under direct ultrasound visualization, a 14 gauge spring-loaded device was used to perform biopsy of a mass in the LEFT axilla using an inferior approach. At the conclusion of the procedure a HYDROMARK 3 COIL tissue marker clip was deployed into the biopsy cavity. Placement was verified sonographically. IMPRESSION: Ultrasound guided biopsy of a LEFT axillary mass versus postsurgical change. No apparent complications. Electronically Signed: By: Meda Klinefelter M.D. On: 03/28/2023 11:57    ASSESSMENT & PLAN:   [name], estrogen  receptor positive [facility]) # July 2023-invasive mammary carcinoma right breast stage I ER/PR positive HER2 negative.  Oncotype-recurrence score 15; which translates to about 4% recurrence in 10 years while on endocrine therapy. Patient s/p postlumpectomy radiation.  DISCONTINUED anastrozole for the last 2 months. [Since FEB 2024; in MAY 2024-Flareup of ulcerative colitis].    # STARTED MAY 9th, 2024- started Letrozole. Tolerating well with out any diarrhea.   # JULY 2024-[s/p CT scan- for pneumonia]; Dr.Cintron-  ORGANIZING FAT NECROSIS AND CHANGES CONSISTENT WITH PRIOR SURGICAL SITE. - NEGATIVE FOR ATYPIA AND MALIGNANCY.- stable.   # Hypokalemia:  [Hx of UC]- stable.   # vit D deficiency: FEB 2024- 29.4; vit D-25 in 3 months/ next visit - pending today.     # Hx of UC- on mesalamine- GI- Dr.Toledo-  stable.   # Osteopenia: FEB 2024- T-score of -1.1. FEB, 2024-Exercise/ Vit D; calcium 1000 units a day. Will repeat BMD in 2 years.   # Genetic counseling: # Genetics: Discussed with the patient majority of breast cancers are sporadic however 10 to 20% at risk of genetic/hereditary cancer syndromes.  Patient interested in genetic counseling.  Patient wants to wait; will let us know.   # DISPOSITION: # follow up 6 month- MD- lab- cbc/cmp;vit D25-OH - Dr.B   All questions were answered. The patient/family knows to call the clinic with any problems, questions or concerns.     Earna Coder, MD 04/22/2023 11:08 AM

## 2023-04-22 NOTE — Assessment & Plan Note (Addendum)
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  Oncotype-recurrence score 15; which translates to about 4% recurrence in 10 years while on endocrine therapy. Patient s/p postlumpectomy radiation.  DISCONTINUED anastrozole for the last 2 months. [Since FEB 2024; in MAY 2024-Flareup of ulcerative colitis].    # STARTED MAY 9th, 2024- started Letrozole. Tolerating well with out any diarrhea.   # JULY 2024-[s/p CT scan- for pneumonia]; Dr.Cintron-  ORGANIZING FAT NECROSIS AND CHANGES CONSISTENT WITH PRIOR SURGICAL SITE. - NEGATIVE FOR ATYPIA AND MALIGNANCY.- stable.   # Hypokalemia:  [Hx of UC]- stable.   # vit D deficiency: FEB 2024- 29.4; vit D-25 in 3 months/ next visit - pending today.     # Hx of UC- on mesalamine- GI- Dr.Toledo-  stable.   # Osteopenia: FEB 2024- T-score of -1.1. FEB, 2024-Exercise/ Vit D; calcium 1000 units a day. Will repeat BMD in 2 years.   # Genetic counseling: # Genetics: Discussed with the patient majority of breast cancers are sporadic however 10 to 20% at risk of genetic/hereditary cancer syndromes.  Patient interested in genetic counseling.  Patient wants to wait; will let us know.   # DISPOSITION: # follow up 6 month- MD- lab- cbc/cmp;vit D25-OH - Dr.B

## 2023-06-07 ENCOUNTER — Other Ambulatory Visit: Payer: Self-pay | Admitting: Internal Medicine

## 2023-06-17 ENCOUNTER — Ambulatory Visit: Payer: BC Managed Care – PPO

## 2023-06-17 DIAGNOSIS — K5190 Ulcerative colitis, unspecified, without complications: Secondary | ICD-10-CM | POA: Diagnosis not present

## 2023-06-17 DIAGNOSIS — K5140 Inflammatory polyps of colon without complications: Secondary | ICD-10-CM | POA: Diagnosis present

## 2023-06-17 DIAGNOSIS — K641 Second degree hemorrhoids: Secondary | ICD-10-CM | POA: Diagnosis not present

## 2023-06-17 DIAGNOSIS — K5120 Ulcerative (chronic) proctitis without complications: Secondary | ICD-10-CM | POA: Diagnosis not present

## 2023-06-17 DIAGNOSIS — Z83719 Family history of colon polyps, unspecified: Secondary | ICD-10-CM | POA: Diagnosis not present

## 2023-09-29 ENCOUNTER — Encounter: Payer: Self-pay | Admitting: Radiation Oncology

## 2023-09-29 ENCOUNTER — Ambulatory Visit
Admission: RE | Admit: 2023-09-29 | Discharge: 2023-09-29 | Disposition: A | Discharge status: Home / Self Care | Payer: 59 | Source: Ambulatory Visit | Attending: Radiation Oncology | Admitting: Radiation Oncology

## 2023-09-29 VITALS — BP 126/78 | HR 59 | Temp 98.7°F | Resp 16 | Ht 64.0 in | Wt 166.4 lb

## 2023-09-29 DIAGNOSIS — Z923 Personal history of irradiation: Secondary | ICD-10-CM | POA: Insufficient documentation

## 2023-09-29 DIAGNOSIS — Z79811 Long term (current) use of aromatase inhibitors: Secondary | ICD-10-CM | POA: Diagnosis not present

## 2023-09-29 DIAGNOSIS — Z170 Estrogen receptor positive status [ER+]: Secondary | ICD-10-CM | POA: Diagnosis not present

## 2023-09-29 DIAGNOSIS — C50312 Malignant neoplasm of lower-inner quadrant of left female breast: Secondary | ICD-10-CM | POA: Insufficient documentation

## 2023-09-29 NOTE — Progress Notes (Signed)
Radiation Oncology Follow up Note  Name: [name]   Date:   09/29/2023 MRN:  [id number] DOB: 1960-10-16    This 62 y.o. female presents to the clinic today for year and a half follow-up status post whole breast radiation to her left breast for stage Ia ER/PR positive invasive mammary carcinoma.  REFERRING PROVIDER: Carren Rang, PA-C  HPI: Patient is a 63 year old female now about a year and a half having completed whole breast radiation to her left breast for stage Ia ER/PR positive base of mammary carcinoma.  Seen today in routine follow-up she is doing well specifically denies breast tenderness cough or bone pain.  She had a mammogram.  Back in July and this sort indeterminant round hypoechoic mass in the deep left axilla corresponding to recent CT findings.  Biopsy showed organizing fat necrosis can changes consistent with prior surgery negative for atypia and malignancy.  Remainder of mammogram was normal.  She is currently on letrozole tolerating it well without side effect  COMPLICATIONS OF TREATMENT: none  FOLLOW UP COMPLIANCE: keeps appointments   PHYSICAL EXAM:  BP 126/78   Pulse (!) 59   Temp 98.7 F (37.1 C) (Tympanic)   Resp 16   Ht 5\' 4"  (1.626 m)   Wt 166 lb 6.4 oz (75.5 kg)   LMP 11/01/2015   BMI 28.56 kg/m  Lungs are clear to A&P cardiac examination essentially unremarkable with regular rate and rhythm. No dominant mass or nodularity is noted in either breast in 2 positions examined. Incision is well-healed. No axillary or supraclavicular adenopathy is appreciated. Cosmetic result is excellent.  Well-developed well-nourished patient in NAD. HEENT reveals PERLA, EOMI, discs not visualized.  Oral cavity is clear. No oral mucosal lesions are identified. Neck is clear without evidence of cervical or supraclavicular adenopathy. Lungs are clear to A&P. Cardiac examination is essentially unremarkable with regular rate and rhythm without murmur rub or thrill. Abdomen is  benign with no organomegaly or masses noted. Motor sensory and DTR levels are equal and symmetric in the upper and lower extremities. Cranial nerves II through XII are grossly intact. Proprioception is intact. No peripheral adenopathy or edema is identified. No motor or sensory levels are noted. Crude visual fields are within normal range.  RADIOLOGY RESULTS: CT scan reviewed compatible with above-stated findings.  There was some lower lobe consolidative lung opacities and she is scheduled for follow-up exam.  PLAN: Present time patient is doing well with no evidence of disease.  And pleased with her overall progress she continues on letrozole without side effect.  I have asked to see her back in 1 year and then we will discontinue follow-up care.  Patient knows to call with any concerns.  I would like to take this opportunity to thank you for allowing me to participate in the care of your patient.Carmina Miller, MD

## 2023-10-27 ENCOUNTER — Inpatient Hospital Stay: Payer: 59 | Admitting: Internal Medicine

## 2023-10-27 ENCOUNTER — Inpatient Hospital Stay: Payer: 59 | Attending: Internal Medicine

## 2023-10-27 ENCOUNTER — Encounter: Payer: Self-pay | Admitting: Internal Medicine

## 2023-10-27 VITALS — BP 108/64 | HR 69 | Temp 98.2°F | Ht 64.0 in | Wt 167.6 lb

## 2023-10-27 DIAGNOSIS — Z170 Estrogen receptor positive status [ER+]: Secondary | ICD-10-CM | POA: Diagnosis not present

## 2023-10-27 DIAGNOSIS — Z79811 Long term (current) use of aromatase inhibitors: Secondary | ICD-10-CM | POA: Insufficient documentation

## 2023-10-27 DIAGNOSIS — M8580 Other specified disorders of bone density and structure, unspecified site: Secondary | ICD-10-CM | POA: Insufficient documentation

## 2023-10-27 DIAGNOSIS — E876 Hypokalemia: Secondary | ICD-10-CM | POA: Insufficient documentation

## 2023-10-27 DIAGNOSIS — C50312 Malignant neoplasm of lower-inner quadrant of left female breast: Secondary | ICD-10-CM | POA: Diagnosis not present

## 2023-10-27 DIAGNOSIS — E559 Vitamin D deficiency, unspecified: Secondary | ICD-10-CM | POA: Diagnosis not present

## 2023-10-27 LAB — VITAMIN D 25 HYDROXY (VIT D DEFICIENCY, FRACTURES): Vit D, 25-Hydroxy: 38.18 ng/mL (ref 30–100)

## 2023-10-27 LAB — CBC WITH DIFFERENTIAL (CANCER CENTER ONLY)
Abs Immature Granulocytes: 0.04 10*3/uL (ref 0.00–0.07)
Basophils Absolute: 0.1 10*3/uL (ref 0.0–0.1)
Basophils Relative: 1 %
Eosinophils Absolute: 0.3 10*3/uL (ref 0.0–0.5)
Eosinophils Relative: 3 %
HCT: 43.2 % (ref 36.0–46.0)
Hemoglobin: 14.5 g/dL (ref 12.0–15.0)
Immature Granulocytes: 0 %
Lymphocytes Relative: 17 %
Lymphs Abs: 1.5 10*3/uL (ref 0.7–4.0)
MCH: 30.4 pg (ref 26.0–34.0)
MCHC: 33.6 g/dL (ref 30.0–36.0)
MCV: 90.6 fL (ref 80.0–100.0)
Monocytes Absolute: 0.6 10*3/uL (ref 0.1–1.0)
Monocytes Relative: 6 %
Neutro Abs: 6.4 10*3/uL (ref 1.7–7.7)
Neutrophils Relative %: 73 %
Platelet Count: 188 10*3/uL (ref 150–400)
RBC: 4.77 MIL/uL (ref 3.87–5.11)
RDW: 13 % (ref 11.5–15.5)
WBC Count: 8.9 10*3/uL (ref 4.0–10.5)
nRBC: 0 % (ref 0.0–0.2)

## 2023-10-27 LAB — CMP (CANCER CENTER ONLY)
ALT: 17 U/L (ref 0–44)
AST: 20 U/L (ref 15–41)
Albumin: 3.8 g/dL (ref 3.5–5.0)
Alkaline Phosphatase: 89 U/L (ref 38–126)
Anion gap: 8 (ref 5–15)
BUN: 16 mg/dL (ref 8–23)
CO2: 26 mmol/L (ref 22–32)
Calcium: 9.9 mg/dL (ref 8.9–10.3)
Chloride: 104 mmol/L (ref 98–111)
Creatinine: 0.87 mg/dL (ref 0.44–1.00)
GFR, Estimated: >60 mL/min (ref >60)
Glucose, Bld: 107 mg/dL — ABNORMAL HIGH (ref 70–99)
Potassium: 4.2 mmol/L (ref 3.5–5.1)
Sodium: 138 mmol/L (ref 135–145)
Total Bilirubin: 0.8 mg/dL (ref 0.0–1.2)
Total Protein: 6.8 g/dL (ref 6.5–8.1)

## 2023-10-27 NOTE — Addendum Note (Signed)
Addended by: Clydia Llano on: 10/27/2023 11:07 AM   Modules accepted: Orders

## 2023-10-27 NOTE — Progress Notes (Signed)
No concerns today

## 2023-10-27 NOTE — Progress Notes (Signed)
 [facility] CONSULT NOTE  Patient Care Team: Carren Rang, PA-C as PCP - General (Physician Assistant) Hulen Luster, RN as Oncology Nurse Navigator Earna Coder, MD as Consulting Physician (Internal Medicine) Earna Coder, MD as Consulting Physician (Internal Medicine) Lemar Livings Merrily Pew, MD as Referring Physician (General Surgery)  CHIEF COMPLAINTS/PURPOSE OF CONSULTATION: Breast cancer  #  Oncology History Overview Note  FINDINGS: A spiculated left breast mass is identified in the lower inner left breast, best seen on the cc view.   Targeted ultrasound is performed, showing a spiculated irregular mass in the left breast at 8 o'clock, 6 cm from the nipple measuring 19 by 10 x 13 mm, correlating with the mammographic finding. No axillary adenopathy.   IMPRESSION: Highly suspicious spiculated left breast mass at 8 o'clock identified mammographically and sonographically.   RECOMMENDATION: Recommend ultrasound-guided biopsy of the left breast mass.   I have discussed the findings and recommendations with the patient. If applicable, a reminder letter will be sent to the patient regarding the next appointment.   BI-RADS CATEGORY  5: Highly suggestive of malignancy.  BREAST, LEFT, 8:00; CORE BIOPSIES:   - INVASIVE MAMMARY CARCINOMA, NO SPECIAL TYPE.   Size of invasive carcinoma: 11.5 mm in this sample  Histologic grade of invasive carcinoma: Grade 2 (of 3)                       Glandular/tubular differentiation score: 3 (of 3)                       Nuclear pleomorphism score: 2 (of 3)                       Mitotic rate score: 1 (of 3)                       Total score: 6 (of 9)  Ductal carcinoma in situ: Present  Lymphovascular invasion: Not identified   ER/PR/HER2: Immunohistochemistry will be performed on block A1, with  reflex to FISH for HER2 2+. The results will be reported in an addendum.   Comment:  The definitive grade will be  assigned on the excisional specimen.    B.  BREAST, LEFT, 9:00; BIOPSIES:   - INVASIVE MAMMARY CARCINOMA, NO SPECIAL TYPE.   Size of invasive carcinoma: 4.5 mm in this sample  Histologic grade of invasive carcinoma: Grade 1 (of 3)                       Glandular/tubular differentiation score: 2 (of 3)                       Nuclear pleomorphism score: 2 (of 3)                       Mitotic rate score: 1 (of 3)                       Total score: 5 (of 9)  Ductal carcinoma in situ: Not identified  Lymphovascular invasion: Not identified   # JULY-AUG 2023 - LEFT BREAST   # 8 o'clock-11 mm-ER/PR positive HER2 negative #9:00-4.5 mm ER/PR positive HER2 negative.   # [Since FEB 2024] Anastrazole- MARCH 2024- flareup of colitis- ? 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  Alone.   [name] 63 y.o.  female history of stage I breast cancer ER/PR positive HER2 negative status postlumpectomy-low risk Oncotype currently s/p radiation is here for follow-up. Patient currently on  letrozole.   Denies any significant side effects. Denies any diarrhea.  No nausea no vomiting.  No fatigue.  Review of Systems  Constitutional:  Negative for chills, diaphoresis, fever, malaise/fatigue and weight loss.  HENT:  Negative for nosebleeds and sore throat.   Eyes:  Negative for double vision.   Respiratory:  Negative for cough, hemoptysis, sputum production, shortness of breath and wheezing.   Cardiovascular:  Negative for chest pain, palpitations, orthopnea and leg swelling.  Gastrointestinal:  Negative for abdominal pain, blood in stool, constipation, diarrhea, heartburn, melena, nausea and vomiting.  Genitourinary:  Negative for dysuria, frequency and urgency.  Musculoskeletal:  Negative for back pain and joint pain.  Skin: Negative.  Negative for itching and rash.  Neurological:  Negative for dizziness, tingling, focal weakness, weakness and headaches.  Endo/Heme/Allergies:  Does not bruise/bleed easily.  Psychiatric/Behavioral:  Negative for depression. The patient is not nervous/anxious and does not have insomnia.      MEDICAL HISTORY:  Past Medical History:  Diagnosis Date   Breast cancer (HCC)    left   GERD (gastroesophageal reflux disease)    History of hiatal hernia    HLD (hyperlipidemia)    Hypertension    Personal history of radiation therapy    Ulcerative colitis (HCC)     SURGICAL HISTORY: Past Surgical History:  Procedure Laterality Date   ABDOMINAL HYSTERECTOMY     AXILLARY SENTINEL NODE BIOPSY Left 07/05/2022   Procedure: AXILLARY SENTINEL NODE BIOPSY;  Surgeon: Earline Mayotte, MD;  Location: [facility] ORS;  Service: General;  Laterality: Left;   BREAST BIOPSY Left 04/21/2022   [place] bx, path pending, ribbon marker   BREAST BIOPSY Left 04/21/2022   [place] bx, path pending, coil marker   BREAST BIOPSY Left 06/09/2022   Procedure: BREAST BIOPSY WITH NEEDLE LOCALIZATION;  Surgeon: Earline Mayotte, MD;  Location: [facility] ORS;  Service: General;  Laterality: Left;   BREAST LUMPECTOMY Left 07/05/2022   BREAST LUMPECTOMY WITH NEEDLE LOCALIZATION Left 07/05/2022   Procedure: BREAST LUMPECTOMY WITH NEEDLE LOCALIZATION;  Surgeon: Earline Mayotte, MD;  Location: [facility] ORS;  Service: General;  Laterality: Left;   breast wire placement Left 07/05/2022   EYE MUSCLE  SURGERY Left 09/14/1967   FRACTURE SURGERY Left 09/13/1972   arm   MANDIBLE SURGERY Bilateral 09/13/1977   UPPER GI ENDOSCOPY  09/13/2012   stretching of esophagus   WISDOM TOOTH EXTRACTION  09/14/1991   3 wisdom teeth    SOCIAL HISTORY: Social History   Socioeconomic History   Marital status: Single    Spouse name: Not on file   Number of children: Not on file   Years of education: Not on file   Highest education level: Not on file  Occupational History   Not on file  Tobacco Use   Smoking status: Never   Smokeless tobacco: Not on file  Vaping Use   Vaping status: Never Used  Substance and Sexual Activity   Alcohol use: No   Drug use: No   Sexual activity: Not on file  Other Topics Concern   Not on file  Social History Narrative   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    Social Drivers of Health   Financial Resource Strain: High Risk (03/09/2023)   Received from [facility], [facility]   Overall Financial Resource Strain (CARDIA)    Difficulty of Paying Living Expenses: Hard  Food Insecurity: No Food Insecurity (03/09/2023)   Received from [facility], [facility]   Hunger Vital Sign    Worried About Running Out of Food in the Last Year: Never true    Ran Out of Food in the Last Year: Never true  Transportation Needs: No Transportation Needs (03/09/2023)   Received from [facility], [facility]   [facility] - Transportation    In the past 12 months, has lack of transportation kept you from medical appointments or from getting medications?: No    Lack of Transportation (Non-Medical): No  Physical Activity: Not on file  Stress: Not on file  Social Connections: Not on file  Intimate Partner Violence: Not on file    FAMILY HISTORY: Family History  Problem Relation Age of Onset   Diabetes Mother    Prostate cancer Father     Breast cancer Sister    Heart attack Brother    Breast cancer Cousin 21    ALLERGIES:  is allergic to amoxicillin.  MEDICATIONS:  Current Outpatient Medications  Medication Sig Dispense Refill   acetaminophen (TYLENOL) 325 MG tablet Take 650 mg by mouth every 6 (six) hours as needed.     atorvastatin (LIPITOR) 10 MG tablet Take 10 mg by mouth every evening.     bisoprolol-hydrochlorothiazide (ZIAC) 5-6.25 MG tablet Take 1 tablet by mouth every morning.     budesonide (ENTOCORT EC) 3 MG 24 hr capsule Take 3 capsules by mouth every morning.     Cholecalciferol (VITAMIN D3) 250 MCG (10000 UT) capsule Take 10,000 Units by mouth daily.     letrozole (FEMARA) 2.5 MG tablet TAKE 1 TABLET BY MOUTH EVERY DAY 30 tablet 4   mesalamine (LIALDA) 1.2 g EC tablet TAKE 1 TABLET (1.2 G TOTAL) BY MOUTH DAILY WITH BREAKFAST FOR 120 DAYS TAKE ONE TABLET DAILY AS PRESCRIBED.     Probiotic Product (ALIGN) 4 MG CAPS Take 1 capsule by mouth daily.     No current facility-administered medications for this visit.    PHYSICAL EXAMINATION: ECOG PERFORMANCE STATUS: 0 - Asymptomatic  Vitals:   10/27/23 1021  BP: 108/64  Pulse: 69  Temp: 98.2 F (36.8 C)  SpO2: 99%     Filed Weights   10/27/23 1021  Weight: 167 lb 9.6 oz (76 kg)      Physical Exam Vitals and nursing note reviewed.  HENT:     Head: Normocephalic and atraumatic.     Mouth/Throat:     Pharynx: Oropharynx is clear.  Eyes:     Extraocular Movements: Extraocular movements intact.     Pupils: Pupils are equal, round, and reactive to light.  Cardiovascular:     Rate and Rhythm: Normal rate and regular rhythm.  Pulmonary:     Comments: Decreased breath sounds bilaterally.  Abdominal:     Palpations: Abdomen is soft.  Musculoskeletal:        General: Normal range of motion.     Cervical back: Normal range of motion.  Skin:    General: Skin is warm.  Neurological:     General: No focal deficit present.     Mental Status:  She is alert and oriented to person, place, and time.  Psychiatric:        Behavior: Behavior normal.        Judgment: Judgment normal.      LABORATORY DATA:  I have reviewed the data as listed Lab Results  Component Value Date   WBC 8.9 10/27/2023   HGB 14.5 10/27/2023   HCT 43.2 10/27/2023   MCV 90.6 10/27/2023   PLT 188 10/27/2023   Recent Labs    03/04/23 1230 04/22/23 0950 10/27/23 1023  NA 132* 137 138  K 3.3* 3.7 4.2  CL 102 104 104  CO2 22 27 26   GLUCOSE 119* 111* 107*  BUN 13 14 16   CREATININE 0.79 0.98 0.87  CALCIUM 9.7 9.9 9.9  GFRNONAA >60 >60 >60  PROT  --  6.9 6.8  ALBUMIN  --  3.8 3.8  AST  --  18 20  ALT  --  18 17  ALKPHOS  --  97 89  BILITOT  --  0.8 0.8    RADIOGRAPHIC STUDIES: I have personally reviewed the radiological images as listed and agreed with the findings in the report. No results found.  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  Oncotype-recurrence score 15; which translates to about 4% recurrence in 10 years while on endocrine therapy. Patient s/p postlumpectomy radiation.  DISCONTINUED anastrozole for the last 2 months. [Since FEB 2024; in MAY 2024-Flareup of ulcerative colitis].  Awaiting mammogram in JULY 2025- Pending.   # STARTED MAY 9th, 2024- started Letrozole. Tolerating well with out any diarrhea.   # Hypokalemia:  [Hx of UC]- stable.   # vit D deficiency: AUG 2024- 65- vit D-25 pending today.     # Hx of UC- on mesalamine- GI- Dr.Toledo-  stable.   # Osteopenia: FEB 2024- T-score of -1.1. FEB, 2024-Exercise/ Vit D; calcium 1000 units a day. Will repeat BMD in 2 years.   # DISPOSITION: # follow up 6 month- MD- lab- cbc/cmp;vit D25-OH - Dr.B    All questions were answered. The patient/family knows to call the clinic with any problems, questions or  concerns.     Earna Coder, MD 10/27/2023 11:05 AM

## 2023-10-27 NOTE — Assessment & Plan Note (Addendum)
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  Oncotype-recurrence score 15; which translates to about 4% recurrence in 10 years while on endocrine therapy. Patient s/p postlumpectomy radiation.  DISCONTINUED anastrozole for the last 2 months. [Since FEB 2024; in MAY 2024-Flareup of ulcerative colitis].  Awaiting mammogram in JULY 2025- Pending.   # STARTED MAY 9th, 2024- started Letrozole. Tolerating well with out any diarrhea.   # Hypokalemia:  [Hx of UC]- stable.   # vit D deficiency: AUG 2024- 65- vit D-25 pending today.     # Hx of UC- on mesalamine- GI- Dr.Toledo-  stable.   # Osteopenia: FEB 2024- T-score of -1.1. FEB, 2024-Exercise/ Vit D; calcium 1000 units a day. Will repeat BMD in 2 years.   # DISPOSITION: # follow up 6 month- MD- lab- cbc/cmp;vit D25-OH - Dr.B

## 2023-11-01 ENCOUNTER — Other Ambulatory Visit: Payer: Self-pay | Admitting: Internal Medicine

## 2024-03-12 ENCOUNTER — Other Ambulatory Visit: Payer: Self-pay | Admitting: General Surgery

## 2024-03-12 DIAGNOSIS — Z1231 Encounter for screening mammogram for malignant neoplasm of breast: Secondary | ICD-10-CM

## 2024-03-12 DIAGNOSIS — Z853 Personal history of malignant neoplasm of breast: Secondary | ICD-10-CM

## 2024-03-22 ENCOUNTER — Other Ambulatory Visit: Payer: Self-pay | Admitting: Internal Medicine

## 2024-03-29 ENCOUNTER — Ambulatory Visit
Admission: RE | Admit: 2024-03-29 | Discharge: 2024-03-29 | Disposition: A | Discharge status: Home / Self Care | Source: Ambulatory Visit | Attending: General Surgery | Admitting: General Surgery

## 2024-03-29 DIAGNOSIS — Z853 Personal history of malignant neoplasm of breast: Secondary | ICD-10-CM | POA: Diagnosis present

## 2024-03-29 DIAGNOSIS — Z1231 Encounter for screening mammogram for malignant neoplasm of breast: Secondary | ICD-10-CM | POA: Diagnosis present

## 2024-04-27 ENCOUNTER — Encounter: Payer: Self-pay | Admitting: Internal Medicine

## 2024-04-27 ENCOUNTER — Inpatient Hospital Stay: Payer: 59 | Admitting: Internal Medicine

## 2024-04-27 ENCOUNTER — Inpatient Hospital Stay: Payer: 59 | Attending: Internal Medicine

## 2024-04-27 DIAGNOSIS — Z803 Family history of malignant neoplasm of breast: Secondary | ICD-10-CM | POA: Insufficient documentation

## 2024-04-27 DIAGNOSIS — C50312 Malignant neoplasm of lower-inner quadrant of left female breast: Secondary | ICD-10-CM | POA: Insufficient documentation

## 2024-04-27 DIAGNOSIS — Z170 Estrogen receptor positive status [ER+]: Secondary | ICD-10-CM | POA: Diagnosis not present

## 2024-04-27 DIAGNOSIS — G4700 Insomnia, unspecified: Secondary | ICD-10-CM | POA: Insufficient documentation

## 2024-04-27 DIAGNOSIS — Z79811 Long term (current) use of aromatase inhibitors: Secondary | ICD-10-CM | POA: Insufficient documentation

## 2024-04-27 DIAGNOSIS — M8580 Other specified disorders of bone density and structure, unspecified site: Secondary | ICD-10-CM | POA: Insufficient documentation

## 2024-04-27 LAB — CBC WITH DIFFERENTIAL (CANCER CENTER ONLY)
Abs Immature Granulocytes: 0.07 K/uL (ref 0.00–0.07)
Basophils Absolute: 0.1 K/uL (ref 0.0–0.1)
Basophils Relative: 1 %
Eosinophils Absolute: 0.2 K/uL (ref 0.0–0.5)
Eosinophils Relative: 2 %
HCT: 41.9 % (ref 36.0–46.0)
Hemoglobin: 14.2 g/dL (ref 12.0–15.0)
Immature Granulocytes: 1 %
Lymphocytes Relative: 10 %
Lymphs Abs: 1.3 K/uL (ref 0.7–4.0)
MCH: 30.9 pg (ref 26.0–34.0)
MCHC: 33.9 g/dL (ref 30.0–36.0)
MCV: 91.1 fL (ref 80.0–100.0)
Monocytes Absolute: 0.8 K/uL (ref 0.1–1.0)
Monocytes Relative: 7 %
Neutro Abs: 9.7 K/uL — ABNORMAL HIGH (ref 1.7–7.7)
Neutrophils Relative %: 79 %
Platelet Count: 214 K/uL (ref 150–400)
RBC: 4.6 MIL/uL (ref 3.87–5.11)
RDW: 12.5 % (ref 11.5–15.5)
WBC Count: 12.2 K/uL — ABNORMAL HIGH (ref 4.0–10.5)
nRBC: 0 % (ref 0.0–0.2)

## 2024-04-27 LAB — CMP (CANCER CENTER ONLY)
ALT: 19 U/L (ref 0–44)
AST: 17 U/L (ref 15–41)
Albumin: 3.8 g/dL (ref 3.5–5.0)
Alkaline Phosphatase: 104 U/L (ref 38–126)
Anion gap: 8 (ref 5–15)
BUN: 16 mg/dL (ref 8–23)
CO2: 25 mmol/L (ref 22–32)
Calcium: 10 mg/dL (ref 8.9–10.3)
Chloride: 103 mmol/L (ref 98–111)
Creatinine: 0.88 mg/dL (ref 0.44–1.00)
GFR, Estimated: >60 mL/min (ref >60)
Glucose, Bld: 101 mg/dL — ABNORMAL HIGH (ref 70–99)
Potassium: 3.5 mmol/L (ref 3.5–5.1)
Sodium: 136 mmol/L (ref 135–145)
Total Bilirubin: 0.6 mg/dL (ref 0.0–1.2)
Total Protein: 7.1 g/dL (ref 6.5–8.1)

## 2024-04-27 LAB — VITAMIN D 25 HYDROXY (VIT D DEFICIENCY, FRACTURES): Vit D, 25-Hydroxy: 48.78 ng/mL (ref 30–100)

## 2024-04-27 MED ORDER — LETROZOLE 2.5 MG PO TABS
2.5000 mg | ORAL_TABLET | Freq: Every day | ORAL | 1 refills | Status: AC
Start: 2024-04-27 — End: ?

## 2024-04-27 NOTE — Progress Notes (Signed)
 Patient is doing good. She is wanting to know if the letrozole  could cause sleep issues, like unable to stay asleep at night?

## 2024-04-27 NOTE — Assessment & Plan Note (Addendum)
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  Oncotype-recurrence score 15; which translates to about 4% recurrence in 10 years while on endocrine therapy. Patient s/p postlumpectomy radiation. [ DISCONTINUED anastrozole  FEB 2024; in MAY 2024-Flareup of ulcerative colitis].    # mammogram in JULY 2025- WNL. # STARTED MAY 9th, 2024- started Letrozole . Tolerating well with out any diarrhea. Refilled Letrzole # 90 pills. Stable.  # Hx of UC- on mesalamine- GI- Dr.Toledo-  stable.   # Osteopenia: FEB 2024- T-score of -1.1. FEB, 2024-Exercise/ Vit D- gummies; calcium 1000 units a day. Will repeat BMD in 2 years.   # Insomnia- recommend Mag threonate at bedtime prn.   # DISPOSITION: # follow up 6 month- MD- lab- cbc/cmp;vit D25-OH- Dr.B

## 2024-04-27 NOTE — Progress Notes (Signed)
 [facility] CONSULT NOTE  Patient Care Team: Franchot Houston, PA-C as PCP - General (Physician Assistant) Georgina Shasta POUR, RN as Oncology Nurse Navigator Rennie Cindy SAUNDERS, MD as Consulting Physician (Internal Medicine) Rennie Cindy SAUNDERS, MD as Consulting Physician (Oncology) Dessa Reyes ORN, MD as Referring Physician (General Surgery)  CHIEF COMPLAINTS/PURPOSE OF CONSULTATION: Breast cancer  #  Oncology History Overview Note  FINDINGS: A spiculated left breast mass is identified in the lower inner left breast, best seen on the cc view.   Targeted ultrasound is performed, showing a spiculated irregular mass in the left breast at 8 o'clock, 6 cm from the nipple measuring 19 by 10 x 13 mm, correlating with the mammographic finding. No axillary adenopathy.   IMPRESSION: Highly suspicious spiculated left breast mass at 8 o'clock identified mammographically and sonographically.   RECOMMENDATION: Recommend ultrasound-guided biopsy of the left breast mass.   I have discussed the findings and recommendations with the patient. If applicable, a reminder letter will be sent to the patient regarding the next appointment.   BI-RADS CATEGORY  5: Highly suggestive of malignancy.  BREAST, LEFT, 8:00; CORE BIOPSIES:   - INVASIVE MAMMARY CARCINOMA, NO SPECIAL TYPE.   Size of invasive carcinoma: 11.5 mm in this sample  Histologic grade of invasive carcinoma: Grade 2 (of 3)                       Glandular/tubular differentiation score: 3 (of 3)                       Nuclear pleomorphism score: 2 (of 3)                       Mitotic rate score: 1 (of 3)                       Total score: 6 (of 9)  Ductal carcinoma in situ: Present  Lymphovascular invasion: Not identified   ER/PR/HER2: Immunohistochemistry will be performed on block A1, with  reflex to FISH for HER2 2+. The results will be reported in an addendum.   Comment:  The definitive grade will be assigned  on the excisional specimen.    B.  BREAST, LEFT, 9:00; BIOPSIES:   - INVASIVE MAMMARY CARCINOMA, NO SPECIAL TYPE.   Size of invasive carcinoma: 4.5 mm in this sample  Histologic grade of invasive carcinoma: Grade 1 (of 3)                       Glandular/tubular differentiation score: 2 (of 3)                       Nuclear pleomorphism score: 2 (of 3)                       Mitotic rate score: 1 (of 3)                       Total score: 5 (of 9)  Ductal carcinoma in situ: Not identified  Lymphovascular invasion: Not identified   # JULY-AUG 2023 - LEFT BREAST   # 8 o'clock-11 mm-ER/PR positive HER2 negative #9:00-4.5 mm ER/PR positive HER2 negative.   # [Since FEB 2024] Anastrazole- MARCH 2024- flareup of colitis- ? 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  Alone.   [name] 63 y.o.  female history of stage I breast cancer ER/PR positive HER2 negative status postlumpectomy-low risk Oncotype currently s/p radiation is here for follow-up. Patient currently on  letrozole .   Denies any significant side effects. Denies any diarrhea.  No nausea no vomiting.  No fatigue.  Review of Systems  Constitutional:  Negative for chills, diaphoresis, fever, malaise/fatigue and weight loss.  HENT:  Negative for nosebleeds and sore throat.   Eyes:  Negative for double vision.  Respiratory:   Negative for cough, hemoptysis, sputum production, shortness of breath and wheezing.   Cardiovascular:  Negative for chest pain, palpitations, orthopnea and leg swelling.  Gastrointestinal:  Negative for abdominal pain, blood in stool, constipation, diarrhea, heartburn, melena, nausea and vomiting.  Genitourinary:  Negative for dysuria, frequency and urgency.  Musculoskeletal:  Negative for back pain and joint pain.  Skin: Negative.  Negative for itching and rash.  Neurological:  Negative for dizziness, tingling, focal weakness, weakness and headaches.  Endo/Heme/Allergies:  Does not bruise/bleed easily.  Psychiatric/Behavioral:  Negative for depression. The patient is not nervous/anxious and does not have insomnia.      MEDICAL HISTORY:  Past Medical History:  Diagnosis Date   Breast cancer (HCC)    left   GERD (gastroesophageal reflux disease)    History of hiatal hernia    HLD (hyperlipidemia)    Hypertension    Personal history of radiation therapy    Ulcerative colitis (HCC)     SURGICAL HISTORY: Past Surgical History:  Procedure Laterality Date   ABDOMINAL HYSTERECTOMY     AXILLARY SENTINEL NODE BIOPSY Left 07/05/2022   Procedure: AXILLARY SENTINEL NODE BIOPSY;  Surgeon: Dessa Reyes ORN, MD;  Location: [facility] ORS;  Service: General;  Laterality: Left;   BREAST BIOPSY Left 04/21/2022   us  bx, path pending, ribbon marker   BREAST BIOPSY Left 04/21/2022   us  bx, path pending, coil marker   BREAST BIOPSY Left 06/09/2022   Procedure: BREAST BIOPSY WITH NEEDLE LOCALIZATION;  Surgeon: Dessa Reyes ORN, MD;  Location: [facility] ORS;  Service: General;  Laterality: Left;   BREAST LUMPECTOMY Left 07/05/2022   BREAST LUMPECTOMY WITH NEEDLE LOCALIZATION Left 07/05/2022   Procedure: BREAST LUMPECTOMY WITH NEEDLE LOCALIZATION;  Surgeon: Dessa Reyes ORN, MD;  Location: [facility] ORS;  Service: General;  Laterality: Left;   breast wire placement Left 07/05/2022   EYE MUSCLE SURGERY Left  09/14/1967   FRACTURE SURGERY Left 09/13/1972   arm   MANDIBLE SURGERY Bilateral 09/13/1977   UPPER GI ENDOSCOPY  09/13/2012   stretching of esophagus   WISDOM TOOTH EXTRACTION  09/14/1991   3 wisdom teeth    SOCIAL HISTORY: Social History   Socioeconomic History   Marital status: Single    Spouse name: Not on file   Number of children: Not on file   Years of education: Not on file   Highest education level: Not on file  Occupational History   Not on file  Tobacco Use   Smoking status: Never   Smokeless tobacco: Not on file  Vaping Use   Vaping status: Never Used  Substance and Sexual Activity   Alcohol use: No   Drug use: No   Sexual activity: Not on file  Other Topics Concern   Not on file  Social History Narrative   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    Social Drivers of Health   Financial Resource Strain: Patient Declined (03/01/2024)   Received from [facility]   Overall Financial Resource Strain (CARDIA)    Difficulty of Paying Living Expenses: Patient declined  Food Insecurity: No Food Insecurity (03/01/2024)   Received from [facility]   Hunger Vital Sign    Within the past 12 months, you worried that your food would run out before you got the money to buy more.: Never true    Within the past 12 months, the food you bought just didn't last and you didn't have money to get more.: Never true  Transportation Needs: No Transportation Needs (03/01/2024)   Received [facility] - Transportation    In the past 12 months, has lack of transportation kept you from medical appointments or from getting medications?: No    Lack of Transportation (Non-Medical): No  Physical Activity: Not on file  Stress: Not on file  Social Connections: Not on file  Intimate Partner Violence: Not on file    FAMILY HISTORY: Family History  Problem Relation Age of Onset    Diabetes Mother    Prostate cancer Father    Breast cancer Sister    Heart attack Brother    Breast cancer Cousin 15    ALLERGIES:  is allergic to amoxicillin.  MEDICATIONS:  Current Outpatient Medications  Medication Sig Dispense Refill   acetaminophen  (TYLENOL ) 325 MG tablet Take 650 mg by mouth every 6 (six) hours as needed.     atorvastatin (LIPITOR) 10 MG tablet Take 10 mg by mouth every evening.     bisoprolol-hydrochlorothiazide (ZIAC) 5-6.25 MG tablet Take 1 tablet by mouth every morning.     budesonide (ENTOCORT EC) 3 MG 24 hr capsule Take 3 capsules by mouth every morning.     Cholecalciferol (VITAMIN D3) 250 MCG (10000 UT) capsule Take 10,000 Units by mouth daily.     mesalamine (LIALDA) 1.2 g EC tablet TAKE 1 TABLET (1.2 G TOTAL) BY MOUTH DAILY WITH BREAKFAST FOR 120 DAYS TAKE ONE TABLET DAILY AS PRESCRIBED.     Probiotic Product (ALIGN) 4 MG CAPS Take 1 capsule by mouth daily.     letrozole  (FEMARA ) 2.5 MG tablet Take 1 tablet (2.5 mg total) by mouth daily. 90 tablet 1   No current facility-administered medications for this visit.    PHYSICAL EXAMINATION: ECOG PERFORMANCE STATUS: 0 - Asymptomatic  Vitals:   04/27/24 1012  BP: 116/77  Pulse: 65  Resp: 18  Temp: (!) 97.5 F (36.4 C)  SpO2: 100%     Filed Weights   04/27/24 1012  Weight: 165 lb (74.8 kg)      Physical Exam Vitals and nursing note reviewed.  HENT:     Head: Normocephalic and atraumatic.     Mouth/Throat:     Pharynx: Oropharynx is clear.  Eyes:     Extraocular Movements: Extraocular movements intact.     Pupils: Pupils are equal, round, and reactive  to light.  Cardiovascular:     Rate and Rhythm: Normal rate and regular rhythm.  Pulmonary:     Comments: Decreased breath sounds bilaterally.  Abdominal:     Palpations: Abdomen is soft.  Musculoskeletal:        General: Normal range of motion.     Cervical back: Normal range of motion.  Skin:    General: Skin is warm.   Neurological:     General: No focal deficit present.     Mental Status: She is alert and oriented to person, place, and time.  Psychiatric:        Behavior: Behavior normal.        Judgment: Judgment normal.      LABORATORY DATA:  I have reviewed the data as listed Lab Results  Component Value Date   WBC 12.2 (H) 04/27/2024   HGB 14.2 04/27/2024   HCT 41.9 04/27/2024   MCV 91.1 04/27/2024   PLT 214 04/27/2024   Recent Labs    10/27/23 1023 04/27/24 0949  NA 138 136  K 4.2 3.5  CL 104 103  CO2 26 25  GLUCOSE 107* 101*  BUN 16 16  CREATININE 0.87 0.88  CALCIUM 9.9 10.0  GFRNONAA >60 >60  PROT 6.8 7.1  ALBUMIN 3.8 3.8  AST 20 17  ALT 17 19  ALKPHOS 89 104  BILITOT 0.8 0.6    RADIOGRAPHIC STUDIES: I have personally reviewed the radiological images as listed and agreed with the findings in the report. MM 3D DIAGNOSTIC MAMMOGRAM BILATERAL BREAST Result Date: 03/29/2024 CLINICAL DATA:  LEFT breast lumpectomy October 2023 status post radiation. On letrozole . Status post benign LEFT axillary biopsy in July 2024. No problems today. Due for annual. EXAM: DIGITAL DIAGNOSTIC BILATERAL MAMMOGRAM WITH TOMOSYNTHESIS AND CAD TECHNIQUE: Bilateral digital diagnostic mammography and breast tomosynthesis was performed. The images were evaluated with computer-aided detection. COMPARISON:  Previous exam(s). ACR Breast Density Category c: The breasts are heterogeneously dense, which may obscure small masses. FINDINGS: There is density and architectural distortion within the LEFT breast, corresponding to the site of prior lumpectomy. Spot compression magnification view(s) demonstrate no evidence of recurrent malignancy. Findings are stable compared to prior. There is no mammographic evidence of malignancy in EITHER breast. IMPRESSION: No evidence of malignancy in EITHER breast status post LEFT breast lumpectomy. RECOMMENDATION: Diagnostic mammogram of the BILATERAL breasts in 1 year to complete  2 years of diagnostic surveillance after her October 2023 lumpectomy. I have discussed the findings and recommendations with the patient. If applicable, a reminder letter will be sent to the patient regarding the next appointment. BI-RADS CATEGORY  2: Benign. Electronically Signed   By: Norleen Croak M.D.   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  Oncotype-recurrence score 15; which translates to about 4% recurrence in 10 years while on endocrine therapy. Patient s/p postlumpectomy radiation. [ DISCONTINUED anastrozole  FEB 2024; in MAY 2024-Flareup of ulcerative colitis].    # mammogram in JULY 2025- WNL. # STARTED MAY 9th, 2024- started Letrozole . Tolerating well with out any diarrhea. Refilled Letrzole # 90 pills. Stable.  # Hx of UC- on mesalamine- GI- Dr.Toledo-  stable.   # Osteopenia: FEB 2024- T-score of -1.1. FEB, 2024-Exercise/ Vit D- gummies; calcium 1000 units a day. Will repeat BMD in 2 years.   # Insomnia-  recommend Mag threonate at bedtime prn.   # DISPOSITION: # follow up 6 month- MD- lab- cbc/cmp;vit D25-OH- Dr.B     All questions were answered. The patient/family knows to call the clinic with any problems, questions or concerns.     Cindy JONELLE Joe, MD 04/27/2024 10:56 AM

## 2024-06-22 ENCOUNTER — Ambulatory Visit

## 2024-06-22 DIAGNOSIS — K5289 Other specified noninfective gastroenteritis and colitis: Secondary | ICD-10-CM | POA: Diagnosis present

## 2024-10-08 ENCOUNTER — Ambulatory Visit: Payer: 59 | Admitting: Radiation Oncology

## 2024-10-11 ENCOUNTER — Encounter: Payer: Self-pay | Admitting: Radiation Oncology

## 2024-10-11 ENCOUNTER — Ambulatory Visit
Admission: RE | Admit: 2024-10-11 | Discharge: 2024-10-11 | Disposition: A | Discharge status: Home / Self Care | Source: Ambulatory Visit | Attending: Radiation Oncology | Admitting: Radiation Oncology

## 2024-10-11 VITALS — BP 132/83 | HR 72 | Temp 98.0°F | Resp 15 | Ht 64.0 in | Wt 169.8 lb

## 2024-10-11 DIAGNOSIS — Z170 Estrogen receptor positive status [ER+]: Secondary | ICD-10-CM | POA: Insufficient documentation

## 2024-10-11 DIAGNOSIS — C50312 Malignant neoplasm of lower-inner quadrant of left female breast: Secondary | ICD-10-CM | POA: Insufficient documentation

## 2024-10-11 DIAGNOSIS — Z79811 Long term (current) use of aromatase inhibitors: Secondary | ICD-10-CM | POA: Insufficient documentation

## 2024-10-11 DIAGNOSIS — Z923 Personal history of irradiation: Secondary | ICD-10-CM | POA: Insufficient documentation

## 2024-10-11 NOTE — Progress Notes (Signed)
 Radiation Oncology Follow up Note  Name: [name]  Date:   10/11/2024 MRN:  [id number] DOB: 1961/01/23    This 64 y.o. female presents to the clinic today for 2-year follow-up status post whole breast radiation to her left breast for stage Ia ER positive invasive mammary carcinoma  REFERRING PROVIDER: Franchot Houston, PA-C  HPI: Patient is a 64 year old female now out 2 years status post whole breast radiation to her left breast for stage Ia ER positive invasive mammary carcinoma.  Seen today in routine follow-up she is doing well.  She specifically denies breast tenderness cough or bone pain..  She had mammograms back in July which I have reviewed were BI-RADS 2 benign.  She is currently on letrozole  tolerating it well without side effect.  COMPLICATIONS OF TREATMENT: none  FOLLOW UP COMPLIANCE: keeps appointments   PHYSICAL EXAM:  BP 132/83   Pulse 72   Temp 98 F (36.7 C)   Resp 15   Ht 5' 4 (1.626 m)   Wt 169 lb 12.8 oz (77 kg)   LMP 11/01/2015   BMI 29.15 kg/m  Lungs are clear to A&P cardiac examination essentially unremarkable with regular rate and rhythm. No dominant mass or nodularity is noted in either breast in 2 positions examined. Incision is well-healed. No axillary or supraclavicular adenopathy is appreciated. Cosmetic result is excellent.  Well-developed well-nourished patient in NAD. HEENT reveals PERLA, EOMI, discs not visualized.  Oral cavity is clear. No oral mucosal lesions are identified. Neck is clear without evidence of cervical or supraclavicular adenopathy. Lungs are clear to A&P. Cardiac examination is essentially unremarkable with regular rate and rhythm without murmur rub or thrill. Abdomen is benign with no organomegaly or masses noted. Motor sensory and DTR levels are equal and symmetric in the upper and lower extremities. Cranial nerves II through XII are grossly intact. Proprioception is intact. No peripheral adenopathy or edema is identified. No  motor or sensory levels are noted. Crude visual fields are within normal range.  RADIOLOGY RESULTS: Mammograms reviewed compatible with above-stated findings  PLAN: At the present time patient is doing well with no evidence of disease now out over 2 years from whole breast radiation.  I am pleased with her overall progress.  She continues close follow-up with medical oncology.  I am going to turn follow-up care over to them now that sat over 2 years.  Patient knows to call at anytime with any concerns.  She continues on letrozole  without side effect.  I would like to take this opportunity to thank you for allowing me to participate in the care of your patient.SABRA Marcey Penton, MD

## 2024-11-01 ENCOUNTER — Ambulatory Visit: Admitting: Internal Medicine

## 2024-11-01 ENCOUNTER — Other Ambulatory Visit
